# Patient Record
Sex: Male | Born: 1960 | Race: Black or African American | Hispanic: No | Marital: Single | Smoking: Former smoker
Health system: Southern US, Community
[De-identification: ages and names within clinical notes are randomized; demographics above are authoritative.]

## PROBLEM LIST (undated history)

## (undated) HISTORY — PX: HERNIA REPAIR: SHX51

---

## 2015-01-08 ENCOUNTER — Ambulatory Visit
Admission: RE | Admit: 2015-01-08 | Discharge: 2015-01-08 | Disposition: A | Discharge status: Home / Self Care | Payer: BC Managed Care – PPO | Source: Ambulatory Visit | Attending: Family Medicine | Admitting: Family Medicine

## 2015-01-08 ENCOUNTER — Other Ambulatory Visit: Payer: Self-pay | Admitting: Family Medicine

## 2015-01-08 DIAGNOSIS — J40 Bronchitis, not specified as acute or chronic: Secondary | ICD-10-CM

## 2018-09-19 ENCOUNTER — Ambulatory Visit
Admission: RE | Admit: 2018-09-19 | Discharge: 2018-09-19 | Disposition: A | Discharge status: Home / Self Care | Payer: BC Managed Care – PPO | Source: Ambulatory Visit | Attending: Family Medicine | Admitting: Family Medicine

## 2018-09-19 ENCOUNTER — Emergency Department (HOSPITAL_COMMUNITY): Payer: BC Managed Care – PPO

## 2018-09-19 ENCOUNTER — Other Ambulatory Visit: Payer: Self-pay

## 2018-09-19 ENCOUNTER — Inpatient Hospital Stay (HOSPITAL_COMMUNITY)
Admission: EM | Admit: 2018-09-19 | Discharge: 2018-09-21 | DRG: 177 | Disposition: A | Discharge status: Home / Self Care | Payer: BC Managed Care – PPO | Source: Ambulatory Visit | Attending: Internal Medicine | Admitting: Internal Medicine

## 2018-09-19 ENCOUNTER — Other Ambulatory Visit: Payer: Self-pay | Admitting: Family Medicine

## 2018-09-19 ENCOUNTER — Encounter (HOSPITAL_COMMUNITY): Payer: Self-pay

## 2018-09-19 DIAGNOSIS — J1289 Other viral pneumonia: Secondary | ICD-10-CM | POA: Diagnosis present

## 2018-09-19 DIAGNOSIS — R05 Cough: Secondary | ICD-10-CM

## 2018-09-19 DIAGNOSIS — R6889 Other general symptoms and signs: Secondary | ICD-10-CM

## 2018-09-19 DIAGNOSIS — I82431 Acute embolism and thrombosis of right popliteal vein: Secondary | ICD-10-CM | POA: Diagnosis present

## 2018-09-19 DIAGNOSIS — Z20822 Contact with and (suspected) exposure to covid-19: Secondary | ICD-10-CM | POA: Diagnosis present

## 2018-09-19 DIAGNOSIS — F17290 Nicotine dependence, other tobacco product, uncomplicated: Secondary | ICD-10-CM | POA: Diagnosis present

## 2018-09-19 DIAGNOSIS — J189 Pneumonia, unspecified organism: Secondary | ICD-10-CM

## 2018-09-19 DIAGNOSIS — U071 COVID-19: Secondary | ICD-10-CM | POA: Diagnosis not present

## 2018-09-19 DIAGNOSIS — R059 Cough, unspecified: Secondary | ICD-10-CM

## 2018-09-19 DIAGNOSIS — I2699 Other pulmonary embolism without acute cor pulmonale: Secondary | ICD-10-CM | POA: Diagnosis present

## 2018-09-19 DIAGNOSIS — J984 Other disorders of lung: Secondary | ICD-10-CM

## 2018-09-19 DIAGNOSIS — I82442 Acute embolism and thrombosis of left tibial vein: Secondary | ICD-10-CM | POA: Diagnosis present

## 2018-09-19 DIAGNOSIS — R5383 Other fatigue: Secondary | ICD-10-CM

## 2018-09-19 DIAGNOSIS — Z7982 Long term (current) use of aspirin: Secondary | ICD-10-CM

## 2018-09-19 DIAGNOSIS — I82441 Acute embolism and thrombosis of right tibial vein: Secondary | ICD-10-CM | POA: Diagnosis present

## 2018-09-19 DIAGNOSIS — J1282 Pneumonia due to coronavirus disease 2019: Secondary | ICD-10-CM | POA: Diagnosis present

## 2018-09-19 DIAGNOSIS — I82403 Acute embolism and thrombosis of unspecified deep veins of lower extremity, bilateral: Secondary | ICD-10-CM | POA: Diagnosis present

## 2018-09-19 LAB — CBC WITH DIFFERENTIAL/PLATELET
Abs Immature Granulocytes: 0.09 10*3/uL — ABNORMAL HIGH (ref 0.00–0.07)
Basophils Absolute: 0.1 10*3/uL (ref 0.0–0.1)
Basophils Relative: 1 %
Eosinophils Absolute: 0.1 10*3/uL (ref 0.0–0.5)
Eosinophils Relative: 2 %
HCT: 39.1 % (ref 39.0–52.0)
Hemoglobin: 12.5 g/dL — ABNORMAL LOW (ref 13.0–17.0)
Immature Granulocytes: 1 %
Lymphocytes Relative: 15 %
Lymphs Abs: 1.1 10*3/uL (ref 0.7–4.0)
MCH: 30.2 pg (ref 26.0–34.0)
MCHC: 32 g/dL (ref 30.0–36.0)
MCV: 94.4 fL (ref 80.0–100.0)
Monocytes Absolute: 0.7 10*3/uL (ref 0.1–1.0)
Monocytes Relative: 10 %
Neutro Abs: 5.3 10*3/uL (ref 1.7–7.7)
Neutrophils Relative %: 71 %
Platelets: 334 10*3/uL (ref 150–400)
RBC: 4.14 MIL/uL — ABNORMAL LOW (ref 4.22–5.81)
RDW: 13.1 % (ref 11.5–15.5)
WBC: 7.4 10*3/uL (ref 4.0–10.5)
nRBC: 0 % (ref 0.0–0.2)

## 2018-09-19 LAB — BLOOD GAS, VENOUS
Acid-Base Excess: 7 mmol/L — ABNORMAL HIGH (ref 0.0–2.0)
Bicarbonate: 31.3 mmol/L — ABNORMAL HIGH (ref 20.0–28.0)
O2 Saturation: 87.6 %
Patient temperature: 98.6
pCO2, Ven: 44.8 mmHg (ref 44.0–60.0)
pH, Ven: 7.459 — ABNORMAL HIGH (ref 7.250–7.430)
pO2, Ven: 56.7 mmHg — ABNORMAL HIGH (ref 32.0–45.0)

## 2018-09-19 LAB — BASIC METABOLIC PANEL
Anion gap: 14 (ref 5–15)
BUN: 19 mg/dL (ref 6–20)
CO2: 26 mmol/L (ref 22–32)
Calcium: 8.8 mg/dL — ABNORMAL LOW (ref 8.9–10.3)
Chloride: 99 mmol/L (ref 98–111)
Creatinine, Ser: 1 mg/dL (ref 0.61–1.24)
GFR calc Af Amer: >60 mL/min (ref >60)
GFR calc non Af Amer: >60 mL/min (ref >60)
Glucose, Bld: 142 mg/dL — ABNORMAL HIGH (ref 70–99)
Potassium: 3.5 mmol/L (ref 3.5–5.1)
Sodium: 139 mmol/L (ref 135–145)

## 2018-09-19 LAB — LACTIC ACID, PLASMA
Lactic Acid, Venous: 1 mmol/L (ref 0.5–1.9)
Lactic Acid, Venous: 1.2 mmol/L (ref 0.5–1.9)

## 2018-09-19 IMAGING — CT CT CHEST WITH CONTRAST
1 series · 14 of 34 positions shown, 18 images · IV contrast (APPLIED)
Comparison: Chest x-ray [DATE]
COMPARISON: Chest x-ray [DATE]

Addendum:
CLINICAL DATA: Abnormal chest x-ray at physician's office. Two
weeks of cough which shortness-of-breath and reports mold exposure
at home.

EXAM:
CT CHEST WITH CONTRAST
TECHNIQUE: Multidetector CT imaging of the chest was performed during
intravenous contrast administration.
CONTRAST:  75mL [3D] IOPAMIDOL ([3D]) INJECTION 76%

[Series 2: chest w/cm · axial · 0.79mm/px · z∈[-329,-13]mm · 14 of 186 slices shown, 18 images]
[im 14/186  mediastinal]
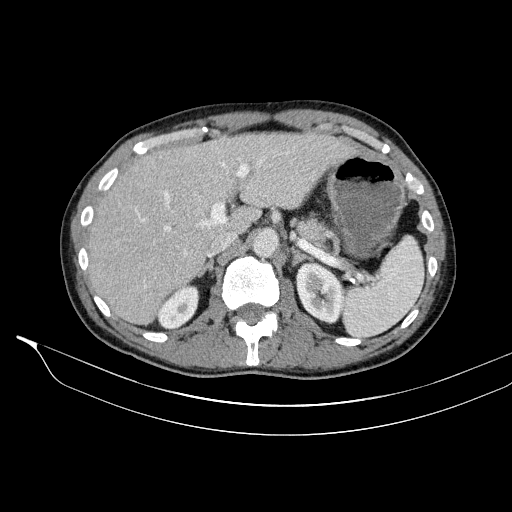
[im 14/186  lung]
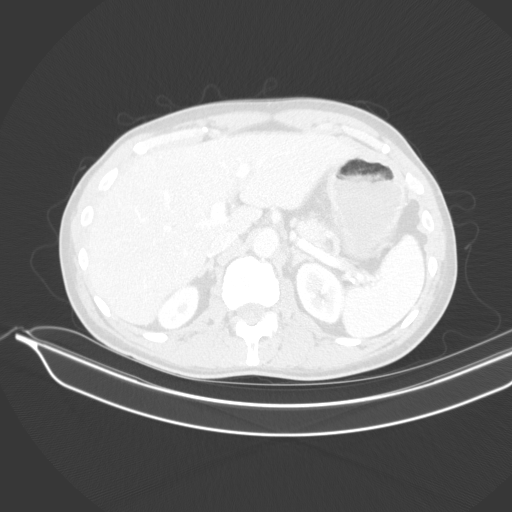
[im 28/186  lung]
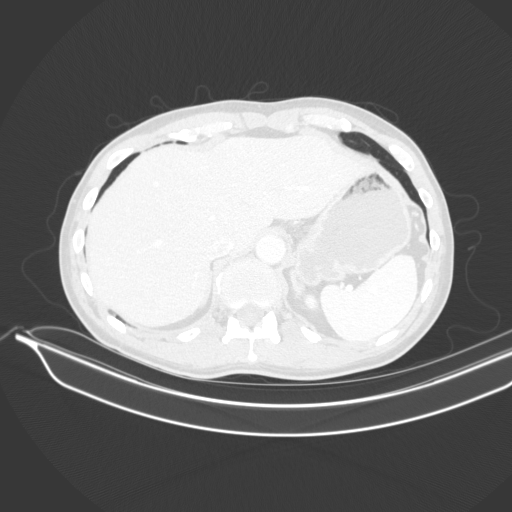
[im 38/186  lung]
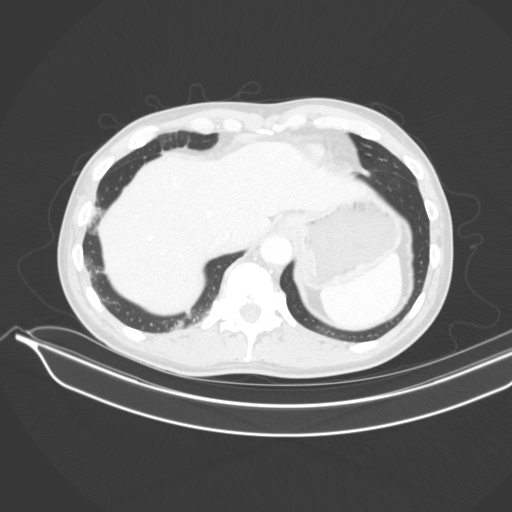
[im 55/186  lung]
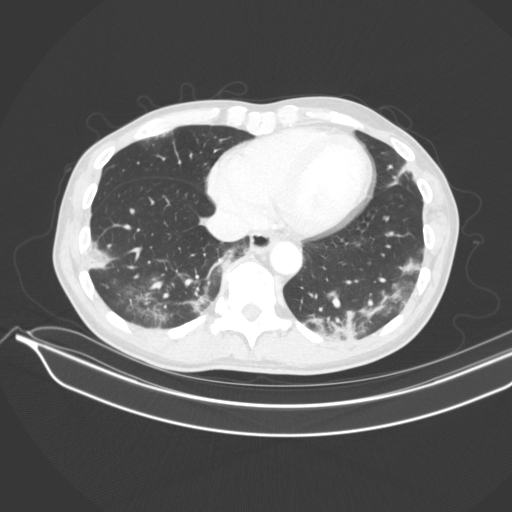
[im 69/186  mediastinal]
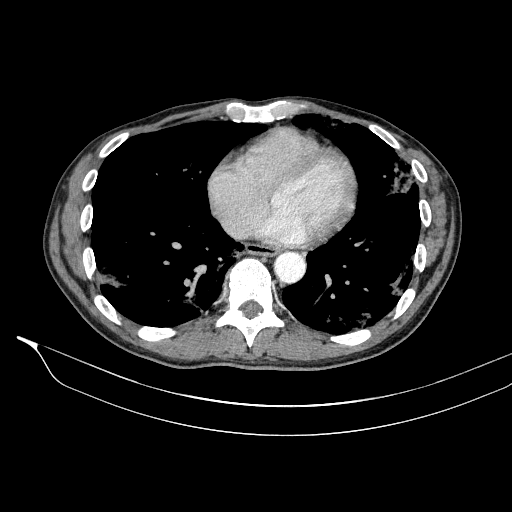
[im 69/186  lung]
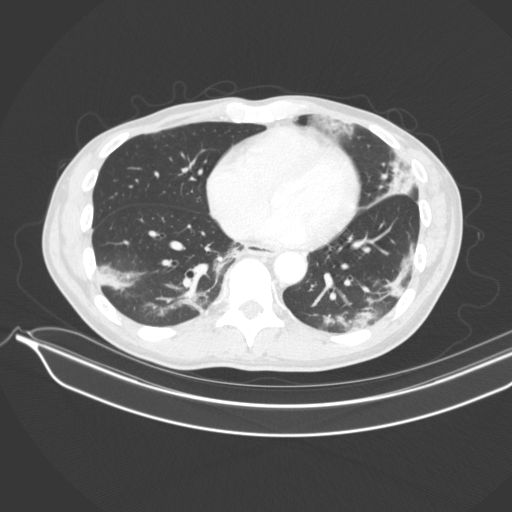
[im 76/186  lung]
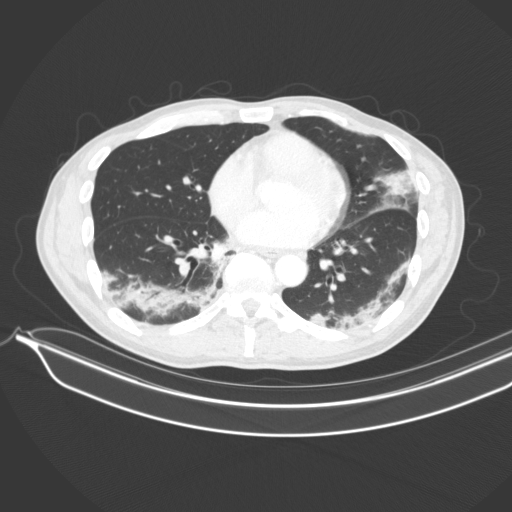
[im 89/186  lung]
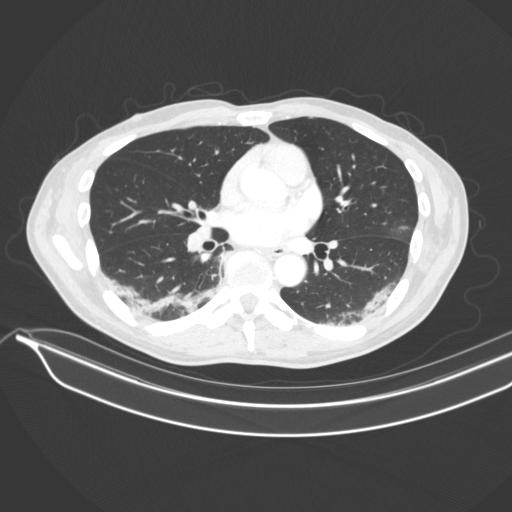
[im 98/186  lung]
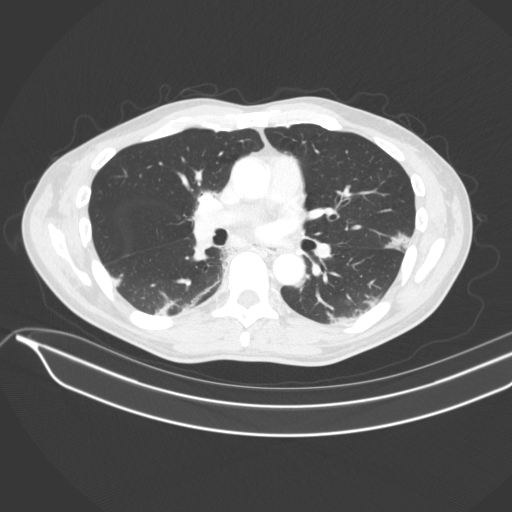
[im 110/186  mediastinal]
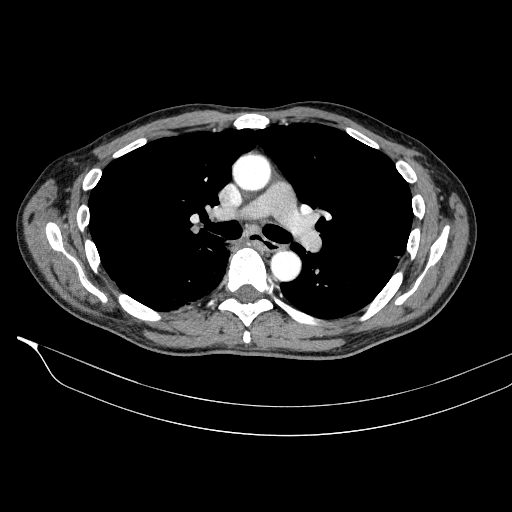
[im 110/186  lung]
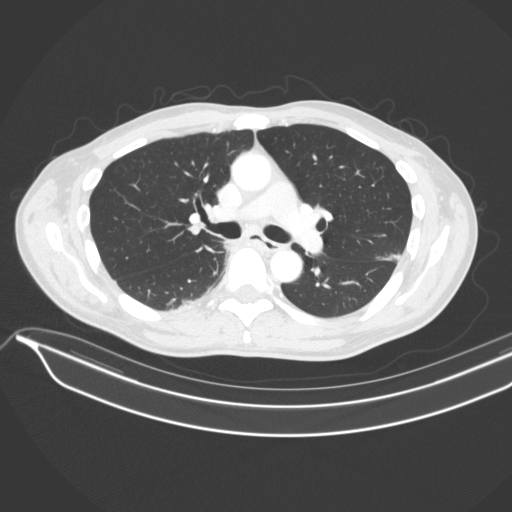
[im 117/186  lung]
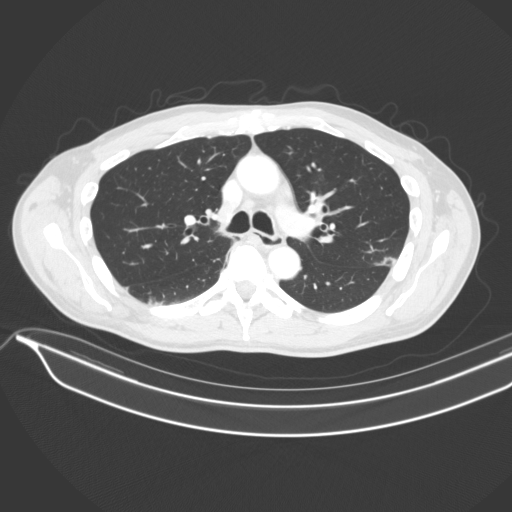
[im 138/186  lung]
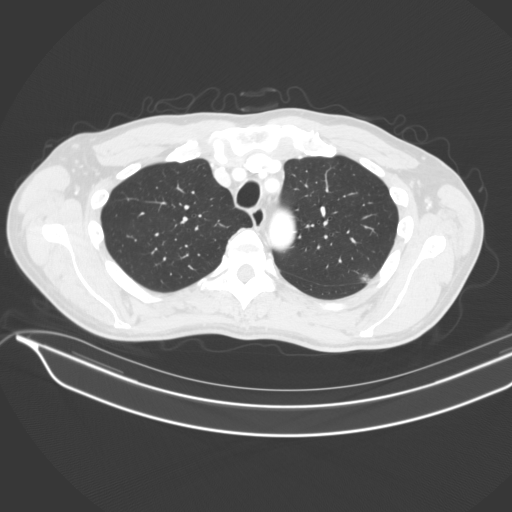
[im 149/186  lung]
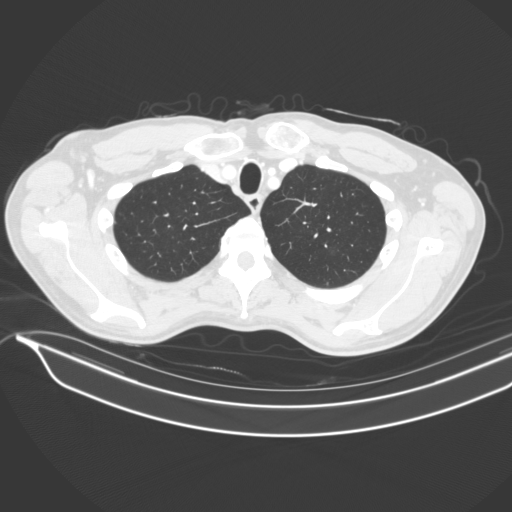
[im 158/186  mediastinal]
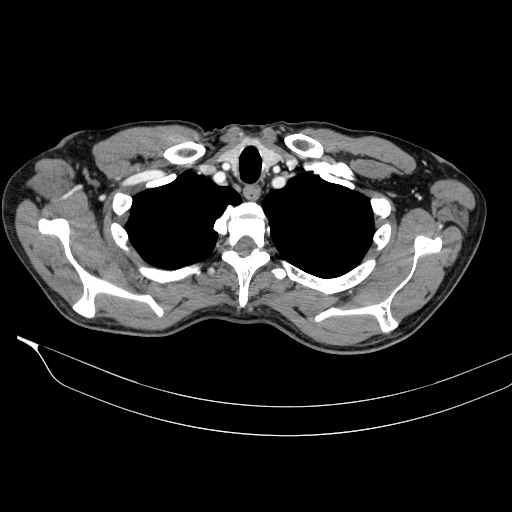
[im 158/186  lung]
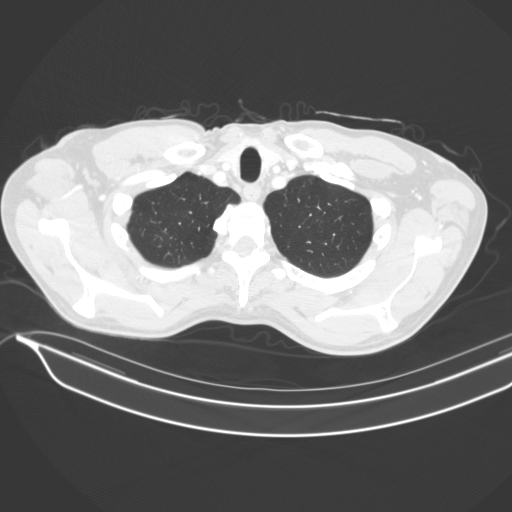
[im 172/186  lung]
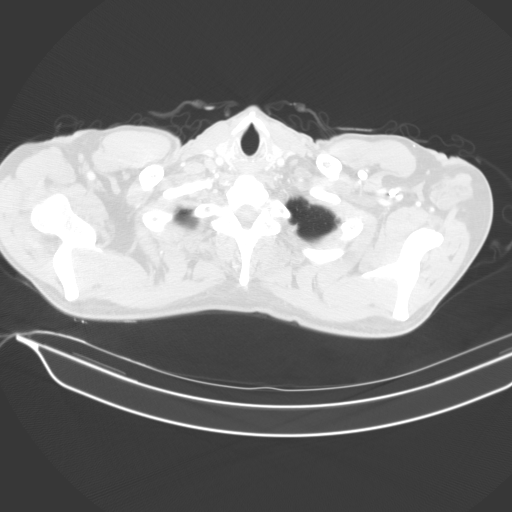

[14 of 34 positions shown; findings below may reference images not displayed]

FINDINGS: Cardiovascular: Heart is normal size. Thoracic aorta is within
normal. Although this study is not optimized for contrast
opacification of the pulmonary arteries, there are findings
suggesting right-sided pulmonary emboli. Remaining vascular
structures are unremarkable.

Mediastinum/Nodes: No mediastinal or hilar adenopathy. Remaining
mediastinal structures are unremarkable. Subcentimeter left thyroid
hypodense nodule.

Lungs/Pleura: Lungs are adequately inflated and demonstrate
bilateral patchy peripheral airspace opacification worse over the
mid to lower lungs. No evidence of effusion. Airways are normal.

Upper Abdomen: No acute findings.

Musculoskeletal: No acute findings.
IMPRESSION: 1. Patchy bilateral peripheral airspace process over the mid to
lower lungs. Findings likely due to multifocal pneumonia which may
be of bacterial or possible viral origin. Recommend follow-up to
resolution.

2. Although this exam is not optimized for pulmonary artery
opacification, there are findings suggesting several right-sided
pulmonary emboli. Recommend CTA of the chest for further evaluation.

ADDENDUM:
Critical Value/emergent results were called by telephone at the time
of interpretation on [DATE] at [DATE] to Dr. WAIRI , who
verbally acknowledged these results.

*** End of Addendum ***
FINDINGS: Cardiovascular: Heart is normal size. Thoracic aorta is within
normal. Although this study is not optimized for contrast
opacification of the pulmonary arteries, there are findings
suggesting right-sided pulmonary emboli. Remaining vascular
structures are unremarkable.

Mediastinum/Nodes: No mediastinal or hilar adenopathy. Remaining
mediastinal structures are unremarkable. Subcentimeter left thyroid
hypodense nodule.

Lungs/Pleura: Lungs are adequately inflated and demonstrate
bilateral patchy peripheral airspace opacification worse over the
mid to lower lungs. No evidence of effusion. Airways are normal.

Upper Abdomen: No acute findings.

Musculoskeletal: No acute findings.
IMPRESSION: 1. Patchy bilateral peripheral airspace process over the mid to
lower lungs. Findings likely due to multifocal pneumonia which may
be of bacterial or possible viral origin. Recommend follow-up to
resolution.

2. Although this exam is not optimized for pulmonary artery
opacification, there are findings suggesting several right-sided
pulmonary emboli. Recommend CTA of the chest for further evaluation.

## 2018-09-19 MED ORDER — IOHEXOL 350 MG/ML SOLN
100.0000 mL | Freq: Once | INTRAVENOUS | Status: AC | PRN
  Administered 2018-09-19: 80 mL via INTRAVENOUS

## 2018-09-19 MED ORDER — SODIUM CHLORIDE (PF) 0.9 % IJ SOLN
INTRAMUSCULAR | Status: AC
  Filled 2018-09-19: qty 50

## 2018-09-19 MED ORDER — SODIUM CHLORIDE 0.9 % IV SOLN
1.0000 g | Freq: Once | INTRAVENOUS | Status: AC
  Administered 2018-09-19: 1 g via INTRAVENOUS
  Filled 2018-09-19: qty 10

## 2018-09-19 MED ORDER — IOPAMIDOL (ISOVUE-370) INJECTION 76%
75.0000 mL | Freq: Once | INTRAVENOUS | Status: AC | PRN
  Administered 2018-09-19: 75 mL via INTRAVENOUS

## 2018-09-19 MED ORDER — SODIUM CHLORIDE 0.9 % IV SOLN
INTRAVENOUS | Status: DC
  Administered 2018-09-19: 22:00:00 via INTRAVENOUS

## 2018-09-19 MED ORDER — SODIUM CHLORIDE 0.9 % IV SOLN
500.0000 mg | Freq: Once | INTRAVENOUS | Status: AC
  Administered 2018-09-19: 500 mg via INTRAVENOUS
  Filled 2018-09-19: qty 500

## 2018-09-19 NOTE — ED Notes (Signed)
Bed: ZC58 Expected date: 09/19/18 Expected time: 7:07 PM Means of arrival:  Comments: Neg Pressure

## 2018-09-19 NOTE — H&P (Addendum)
Derek Norris NWG:[id number] DOB: [birth year] DOA: 09/19/2018     PCP: Patient, No Pcp Per   Outpatient Specialists:  NONE    Patient arrived to ER on 09/19/18 at 2026  Patient coming from: home Lives alone,        Chief Complaint: abnormal CT HPI: Derek Norris is a 58 y.o. 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 He wears mask and gloves.  He has visited his brother  As far as he knows no sick contacts  He had some fever inially he is unsure how high, no Gi symptoms No sore throat, no chest pain,  3 days ago he developed leg tightness and pain Reports change in appetite  He is try to use Tessalon to help with his cough but did not seem to help.  Have not had any fever or chills no nausea vomiting no lightheadedness no fatigue. Chest CT done at a facility today showed bilateral pneumonia questionable PE although it was not done with contrast  Infectious risk factors:  Reports  fever, shortness of breath, dry cough,   severe fatigue   In  ER RAPID COVID TEST   Pending   Regarding pertinent Chronic problems: none     While in ER: Noted on Rocephin and azithromycin given IV fluids CTA confirmed pulmonary embolism but also showed bilateral patchy infiltrates worrisome for COVID-19   The following Work up has been ordered so far:  Orders Placed This Encounter  Procedures   SARS Coronavirus 2 (CEPHEID - Performed in [facility] hospital lab), Hosp Order   Blood Culture (routine x 2)   CT Angio Chest PE W/Cm &/Or Wo Cm   Blood gas, venous   Lactic acid, plasma   Basic metabolic panel   CBC with Differential   Brain natriuretic peptide   Troponin I - Add-On to  previous collection   Hepatic function panel   CK   Magnesium   Protime-INR   Procalcitonin - Baseline   Procalcitonin   Urinalysis, Routine w reflex microscopic   Troponin I - Now Then Q6H   Lactic acid, plasma   D-dimer, quantitative   Lactate dehydrogenase   Ferritin   Triglycerides   Fibrinogen   C-reactive protein   Sedimentation rate   APTT   Interleukin-6, Plasma   Place surgical mask on patient   Patient to wear surgical mask during transportation   Assess patient for ability to self-prone. If able (can move self in bed, ambulate) and stable (SpO2 and oxygen requirement):   Notify EDP if new oxygen requirements escalates > 4L per minute [place]   RN to draw the following extra tubes:   Place on droplet/contact if:   Place on airborne/contact if:   RN/NT - Document specific oxygen requirements in [facility]   Cardiac monitoring   Consult to hospitalist  ALL PATIENTS BEING ADMITTED/HAVING PROCEDURES NEED COVID-19 SCREENING   heparin per pharmacy consult   Consult to care management   Droplet and Contact precautions   EKG 12-Lead   ABO/Rh   Place in observation (patient's expected length of stay will be less than 2 midnights)  Place in observation (patient's expected length of stay will be less than 2 midnights)   Following Medications were ordered in ER: Medications  0.9 %  sodium chloride infusion ( Intravenous New Bag/Given 09/19/18 2151)  sodium chloride (PF) 0.9 % injection (has no administration in time range)  heparin bolus via infusion 5,000 Units (has no administration in time range)  heparin ADULT infusion 100 units/mL (25000 units/23450mL sodium chloride 0.45%) (has no administration in time range)  cefTRIAXone (ROCEPHIN) 1 g in sodium chloride 0.9 % 100 mL IVPB (0 g Intravenous Stopped 09/19/18 2255)  azithromycin (ZITHROMAX) 500 mg in sodium chloride 0.9 % 250 mL IVPB (0 mg Intravenous Stopped 09/20/18 0015)  iohexol (OMNIPAQUE) 350  MG/ML injection 100 mL (80 mLs Intravenous Contrast Given 09/19/18 2301)        Consult Orders  (From admission, onward)         Start     Ordered   09/20/18 0043  Consult to care management  Once    Comments: DOAC  Provider:  (Not yet assigned)  Question:  Reason for consult:  Answer:  Medication needs   09/20/18 0043   09/19/18 2333  Consult to hospitalist  ALL PATIENTS BEING ADMITTED/HAVING PROCEDURES NEED COVID-19 SCREENING  Once    Comments: ALL PATIENTS BEING ADMITTED/HAVING PROCEDURES NEED COVID-19 SCREENING  Provider:  (Not yet assigned)  Question Answer Comment  Place call to: [facility]   Reason for Consult Admit      09/19/18 2332           Significant initial  Findings: Abnormal Labs Reviewed  BLOOD GAS, VENOUS - Abnormal; Notable for the following components:      Result Value   pH, Ven 7.459 (*)    pO2, Ven 56.7 (*)    Bicarbonate 31.3 (*)    Acid-Base Excess 7.0 (*)    All other components within normal limits  BASIC METABOLIC PANEL - Abnormal; Notable for the following components:   Glucose, Bld 142 (*)    Calcium 8.8 (*)    All other components within normal limits  CBC WITH DIFFERENTIAL/PLATELET - Abnormal; Notable for the following components:   RBC 4.14 (*)    Hemoglobin 12.5 (*)    Abs Immature Granulocytes 0.09 (*)    All other components within normal limits  HEPATIC FUNCTION PANEL - Abnormal; Notable for the following components:   Albumin 2.9 (*)    Indirect Bilirubin 0.1 (*)    All other components within normal limits     Otherwise labs showing:    Recent Labs  Lab 09/19/18 2120 09/20/18 0012  NA 139  --   K 3.5  --   CO2 26  --   GLUCOSE 142*  --   BUN 19  --   CREATININE 1.00  --   CALCIUM 8.8*  --   MG  --  2.1    Cr  stable,   Lab Results  Component Value Date   CREATININE 1.00 09/19/2018    Recent Labs  Lab 09/20/18 0012  AST 16  ALT 14  ALKPHOS 45  BILITOT 0.3  PROT 6.8  ALBUMIN 2.9*   Lab  Results  Component Value Date   CALCIUM 8.8 (L) 09/19/2018    WBC      Component Value Date/Time   WBC 7.4 09/19/2018 2120   ANC    Component Value Date/Time   NEUTROABS 5.3 09/19/2018 2120   ALC 1.1   Plt: Lab Results  Component Value Date   PLT 334 09/19/2018    Lactic Acid, Venous    Component Value Date/Time   LATICACIDVEN 1.2 09/19/2018 2325    Procalcitonin pending   COVID-19 Labs pending      Venous  Blood Gas result:  pH 7.459  pCO2 44;   ABG    Component Value Date/Time   HCO3 31.3 (H) 09/19/2018 2138   O2SAT 87.6 09/19/2018 2138      HG/HCT stable,       Component Value Date/Time   HGB 12.5 (L) 09/19/2018 2120   HCT 39.1 09/19/2018 2120     Troponin pending Cardiac Panel (last 3 results) Recent Labs    09/20/18 0012  CKTOTAL 78  TROPONINI <0.03     BNP (last 3 results) No results for input(s): BNP in the last 8760 hours.  ProBNP (last 3 results) No results for input(s): PROBNP in the last 8760 hours.    UA   ordered   Urine analysis: No results found for: COLORURINE, APPEARANCEUR, LABSPEC, PHURINE, GLUCOSEU, HGBUR, BILIRUBINUR, KETONESUR, PROTEINUR, UROBILINOGEN, NITRITE, LEUKOCYTESUR     CTA chest -   PE, bilateral  Infiltrates worrisome for Covid 19   ECG: ordered        ED Triage Vitals  Enc Vitals Group     BP 09/19/18 2034 (!) 157/108     Pulse Rate 09/19/18 2034 99     Resp 09/19/18 2034 20     Temp 09/19/18 2034 97.8 F (36.6 C)     Temp Source 09/19/18 2034 Oral     SpO2 09/19/18 2034 98 %     Weight 09/19/18 2048 170 lb (77.1 kg)     Height 09/19/18 2048  (2.007 m)     Head Circumference --      Peak Flow --      Pain Score 09/19/18 2036 0     Pain Loc --      Pain Edu? --      Excl. in GC? --   TMAX(24)@       Latest  Blood pressure (!) 146/91, pulse 77, temperature 97.8 F (36.6 C), temperature source Oral, resp. rate 18, height  (2.007 m), weight 77.1 kg, SpO2 97 %.     Hospitalist was  called for admission for bilateral infiltrates worrisome for viral pneumonia presumed COVID-19 and pulmonary embolism   Review of Systems:    Pertinent positives include: non-productive cough,  Constitutional:  No weight loss, night sweats, Fevers, chills, fatigue, weight loss  HEENT:  No headaches, Difficulty swallowing,Tooth/dental problems,Sore throat,  No sneezing, itching, ear ache, nasal congestion, post nasal drip,  Cardio-vascular:  No chest pain, Orthopnea, PND, anasarca, dizziness, palpitations.no Bilateral lower extremity swelling  GI:  No heartburn, indigestion, abdominal pain, nausea, vomiting, diarrhea, change in bowel habits, loss of appetite, melena, blood in stool, hematemesis Resp:  noshortness of breath at rest. No dyspnea on exertion , No excess mucus, no productive cough, No No coughing up of blood.No change in color of mucus.No wheezing. Skin:  no rash or lesions. No jaundice GU:  no dysuria, change in color of urine, no urgency or frequency. No straining to urinate.  No flank pain.  Musculoskeletal:  No joint pain or no joint swelling. No decreased range of motion. No back pain.  Psych:  No change in mood or affect. No depression or anxiety. No memory loss.  Neuro: no localizing neurological complaints, no tingling, no weakness, no double vision, no  gait abnormality, no slurred speech, no confusion  All systems reviewed and apart from HOPI all are negative  Past Medical History:  History reviewed. No pertinent past medical history.    Past Surgical History:  Procedure Laterality Date   HERNIA REPAIR      Social History:  Ambulatory    Independently      reports that he has been smoking cigars. He has never used smokeless tobacco. He reports current alcohol use. He reports that he does not use drugs.     Family History:   Family History  Problem Relation Age of Onset   Pancreatic cancer Mother    Hypertension Father    Hypertension  Brother     Allergies: No Known Allergies   Prior to Admission medications   Medication Sig Start Date End Date Taking? Authorizing Provider  acetaminophen (TYLENOL) 325 MG tablet Take 650 mg by mouth every 6 (six) hours as needed for fever or headache.   Yes [provider]  Ascorbic Acid (VITAMIN C) 1000 MG tablet Take 1,000 mg by mouth daily.   Yes [provider]  aspirin 325 MG EC tablet Take 650 mg by mouth daily as needed for pain (blood clot).   Yes [provider]  benzonatate (TESSALON) 200 MG capsule Take 200 mg by mouth 3 (three) times daily. 09/16/18  Yes [provider]  DM-APAP-CPM 30-650-4 MG/30ML LIQD Take 30 mLs by mouth at bedtime as needed (sleep).   Yes [provider]  DM-Phenylephrine-Acetaminophen (ALKA-SELTZER PLS SINUS & COUGH PO) Take 2 tablets by mouth at bedtime as needed (cough and sleep).   Yes [provider]  multivitamin-iron-minerals-folic acid (CENTRUM) chewable tablet Chew 1 tablet by mouth daily.   Yes [provider]  promethazine-codeine (PHENERGAN WITH CODEINE) 6.25-10 MG/5ML syrup Take 5 mLs by mouth every 6 (six) hours as needed for cough.   Yes [provider]   Physical Exam: Blood pressure (!) 146/91, pulse 77, temperature 97.8 F (36.6 C), temperature source Oral, resp. rate 18, height 6\' 7"  (2.007 m), weight 77.1 kg, SpO2 97 %. 1. General:  in No  Acute distress   well   -appearing 2. Psychological: Alert and   Oriented 3. Head/ENT:    Dry Mucous Membranes                           Head Non traumatic, neck supple                            Poor Dentition 4. SKIN:   decreased Skin turgor,  Skin clean Dry and intact no rash 5. Heart: Regular rate and rhythm no  Murmur, no Rub or gallop 6. Lungs:  no wheezes but bilateral  crackles   7. Abdomen: Soft, non-tender, Non distended   bowel sounds present 8. Lower extremities: no clubbing, cyanosis, no  edema 9. Neurologically  Grossly intact, moving all 4 extremities equally  10. MSK: Normal range of motion   All other LABS:     Recent Labs  Lab 09/19/18 2120  WBC 7.4  NEUTROABS 5.3  HGB 12.5*  HCT 39.1  MCV 94.4  PLT 334     Recent Labs  Lab 09/19/18 2120 09/20/18 0012  NA 139  --   K 3.5  --   CL 99  --   CO2 26  --   GLUCOSE 142*  --  BUN 19  --   CREATININE 1.00  --   CALCIUM 8.8*  --   MG  --  2.1     Recent Labs  Lab 09/20/18 0012  AST 16  ALT 14  ALKPHOS 45  BILITOT 0.3  PROT 6.8  ALBUMIN 2.9*       Cultures: No results found for: SDES, SPECREQUEST, CULT, REPTSTATUS   Radiological Exams on Admission: Ct Chest W Contrast  Addendum Date: 09/19/2018   ADDENDUM REPORT: 09/19/2018 16:09 ADDENDUM: Critical Value/emergent results were called by telephone at the time of interpretation on 09/19/2018 at 4:09 pm to Dr. Charlesetta Shanks , who verbally acknowledged these results. Electronically Signed   By: Elberta Fortis M.D.   On: 09/19/2018 16:09   Result Date: 09/19/2018 CLINICAL DATA:  Abnormal chest x-ray at physician's office. Two weeks of cough which shortness-of-breath and reports mold exposure at home. EXAM: CT CHEST WITH CONTRAST TECHNIQUE: Multidetector CT imaging of the chest was performed during intravenous contrast administration. CONTRAST:  75mL ISOVUE-370 IOPAMIDOL (ISOVUE-370) INJECTION 76% COMPARISON:  Chest x-ray 09/16/2018 FINDINGS: Cardiovascular: Heart is normal size. Thoracic aorta is within normal. Although this study is not optimized for contrast opacification of the pulmonary arteries, there are findings suggesting right-sided pulmonary emboli. Remaining vascular structures are unremarkable. Mediastinum/Nodes: No mediastinal or hilar adenopathy. Remaining mediastinal structures are unremarkable. Subcentimeter left thyroid hypodense nodule. Lungs/Pleura: Lungs are adequately inflated and demonstrate bilateral patchy peripheral airspace opacification worse over the mid to  lower lungs. No evidence of effusion. Airways are normal. Upper Abdomen: No acute findings. Musculoskeletal: No acute findings. IMPRESSION: 1. Patchy bilateral peripheral airspace process over the mid to lower lungs. Findings likely due to multifocal pneumonia which may be of bacterial or possible viral origin. Recommend follow-up to resolution. 2. Although this exam is not optimized for pulmonary artery opacification, there are findings suggesting several right-sided pulmonary emboli. Recommend CTA of the chest for further evaluation. Electronically Signed: By: Elberta Fortis M.D. On: 09/19/2018 15:48   Ct Angio Chest Pe W/cm &/or Wo Cm  Addendum Date: 09/19/2018   ADDENDUM REPORT: 09/19/2018 23:32 ADDENDUM: Critical Value/emergent results were called by telephone at the time of interpretation on 09/19/2018 at 2328 hours to Dr. Tommas Olp in the ED who verbally acknowledged these results. Electronically Signed   By: Odessa Fleming M.D.   On: 09/19/2018 23:32   Result Date: 09/19/2018 CLINICAL DATA:  58 year old male with abnormal chest CT earlier today. Shortness of breath. Pending COVID-19 testing. EXAM: CT ANGIOGRAPHY CHEST WITH CONTRAST TECHNIQUE: Multidetector CT imaging of the chest was performed using the standard protocol during bolus administration of intravenous contrast. Multiplanar CT image reconstructions and MIPs were obtained to evaluate the vascular anatomy. CONTRAST:  80mL OMNIPAQUE IOHEXOL 350 MG/ML SOLN COMPARISON:  Chest CT 1518 hours. FINDINGS: Cardiovascular: Good contrast bolus timing in the pulmonary arterial tree. No pulmonary artery filling defect identified in the left lung, but there is pulmonary artery clot at the branching of the right main pulmonary artery extending into the right upper lobe (series 5, image 141, right middle lobe (image 167), and right lower lobe (image 180). Much of the visible thrombus is non occlusive. There is some occlusive thrombus suspected in the posterior  basal segment of the right lower lobe. There is some occlusive thrombus visible in the subsegmental branches to the medial segment of the right middle lobe. No central or saddle embolus. Stable cardiac size at the upper limits of normal. No pericardial effusion. Negative visible  aorta. Mediastinum/Nodes: Negative, no lymphadenopathy. Lungs/Pleura: Major airways remain patent. Confluent but indistinct bilateral peripheral lower lobe, left upper lobe and lingula opacity redemonstrated and this does not directly correspond to areas of pulmonary emboli. No pleural effusion. Upper Abdomen: Negative visible liver, spleen, pancreas, adrenal glands, kidneys, and bowel in the upper abdomen. Musculoskeletal: No acute osseous abnormality identified. Review of the MIP images confirms the above findings. IMPRESSION: 1. Confirmed acute pulmonary emboli with lobar involvement throughout the right lung. However, much of the visible thrombus is nonocclusive, and does not appear to directly correspond to the abnormal pulmonary opacity. 2. Continued superimposed bilateral pulmonary opacity in a pattern suspicious for COVID-19 pneumonia. No pleural effusion. Electronically Signed: By: Genevie Ann M.D. On: 09/19/2018 23:25    Chart has been reviewed    Assessment/Plan   58 y.o. male with no  Significant medical history       Admitted for bilateral infiltrates worrisome for viral pneumonia presumed COVID-19 and pulmonary embolism  Present on Admission:  CAP (community acquired pneumonia) -suspect viral etiology. Already received Rocephin and azithromycin for now will continue until further testing results   Pulmonary embolism ([place]) -  Admit to   Telemetry   Would likely benefit from case manager consult for long term anticoagulation Hold home blood pressure medications avoid hypotension Cycle cardiac enzymes Order echogram and lower extremity Dopplers  Most likely risk factors for hypercoagulable state being  Unknown,  if COVID 19 is positive likely the cause otherwise will need further workup    Suspected Covid-19 Virus Infection -  Initial Rapid Novel Corona Virus testing:  Ordered 09/20/18 and is  pending     - As per hx pt with evidence of fever  Cough: Severe fatigue    Following concerning LAB/ imaging findings:    CT chest: Right side PE bilateral infiltrates with pattern suspicious for COVID-19 Other labs are pending at this time  -Following work-up initiated:        sputum cultures  Ordered 09/20/18, Blood cultures  Ordered 09/20/18,   Novel Corona Virus testing: Ordered      Following complications noted:     will be monitoring for evidence of other complications such as PE/DVT CVA or  cardiovascular events  Plan of treatment: - Transfer to [facility] facility if positive and beds are available  - If patient tests negative, and no evidence of hypoxia with improving clinical status would discharge to home with instructions to continue to self quarantine -Patient with known pulmonary embolism started on heparin - Assess for ability to prone  - Supportive management -Fluid sparing resuscitation  -Provide oxygen as needed currently on room air SpO2: 95 %   To biotics initiated in emergency department if no evidence of bacterial infectious process would discontinue - Consult PCCM if becomes respiratory unstable   Poor Prognostic factors  58 y.o.    Risk for transmission of COVID 19   LOW Given   no evidence of severe cough,  oxygen requirement above 4 L, need for nebulizer, or other high risk of aerosolization     Will order Droplet and Contact precautions  Family/ patient prognosis discussion:  I have asked case with the patient  who are aware of patient's prognosis At this point they would like  atient to be full code    Other plan as per orders.  DVT prophylaxis:  Heparin gtt   Code Status:  FULL CODE as per patient   I had personally discussed CODE  STATUS with patient   Family Communication:   Family not at  Bedside    Disposition Plan:      To home once workup is complete and patient is stable                Consults called:none   Admission status:  ED Disposition    ED Disposition Condition Comment   Ad[facility]  [facility][id number]  Level of Care: Telemetry [5]  Admit to tele based on following criteria: Other see comments  Comments: pe  Covid Evaluation: Person Under Investigation (PUI)  Isolation Risk Level: Low Risk/Droplet (Less than 4L [place] supplementation)  Diagnosis: Pulmonary embolus (HCC) [811914][[id number]]  Admitting Physician: Therisa DoyneUTOVA, Evey Mcmahan [[id number]]  Attending Physician: Therisa DoyneUTOVA, [place]wood Revoir [[id number]]  PT Class (Do Not Modify): Observation [104]  PT Acc Code (Do Not Modify): Observation [10022]        Obs    Level of care   tele  For   24H     Precautions:   Droplet and Contact precautions  PPE: Used by the provider:   P100  eye Goggles,  Gloves  gown   Oanh Devivo 09/20/2018, 1:03 AM    [facility]     after 2 AM please page floor coverage PA If 7AM-7PM, please contact the day team taking care of the patient using [email or web address]

## 2018-09-19 NOTE — ED Triage Notes (Signed)
Pt was seen at his primary today and had a CT that showed possible pneumonia and/or PE. Pt had a cough for a week and went to Urgent Care and then was referred to [facility], had a CT scan today and was then called to come to the ED, Pt has not been tested for COVID at any of these visits

## 2018-09-19 NOTE — ED Provider Notes (Signed)
[facility] DEPT Provider Note   CSN: [id number] Arrival date & time: 09/19/18  2026    History   Chief Complaint No chief complaint on file.   HPI Derek Norris is a 58 y.o. male.     HPI   He presents for evaluation of cough present for 2 weeks, worsening despite treatment with Tessalon.  He does not have shortness of breath.  Denies fever, chills, nausea, vomiting, weakness or dizziness.  Today he had a chest x-ray and CT done, indicating pneumonia and possible PE.  The chest CT was not done with angiography technique.  The results, are with the patient, and indicate that he has bilateral pneumonia and possible right-sided PE.  He does not have a history of venous thromboembolism.  No cardiac history.  No known sick contacts.  There are no other known modifying factors.  History reviewed. No pertinent past medical history.  There are no active problems to display for this patient.   History reviewed. No pertinent surgical history.      Home Medications    Prior to Admission medications   Medication Sig Start Date End Date Taking? Authorizing Provider  acetaminophen (TYLENOL) 325 MG tablet Take 650 mg by mouth every 6 (six) hours as needed for fever or headache.   Yes [provider]  Ascorbic Acid (VITAMIN C) 1000 MG tablet Take 1,000 mg by mouth daily.   Yes [provider]  aspirin 325 MG EC tablet Take 650 mg by mouth daily as needed for pain (blood clot).   Yes [provider]  benzonatate (TESSALON) 200 MG capsule Take 200 mg by mouth 3 (three) times daily. 09/16/18  Yes [provider]  DM-APAP-CPM 30-650-4 MG/30ML LIQD Take 30 mLs by mouth at bedtime as needed (sleep).   Yes [provider]  DM-Phenylephrine-Acetaminophen (ALKA-SELTZER PLS SINUS & COUGH PO) Take 2 tablets by mouth at bedtime as needed (cough and sleep).   Yes [provider]  multivitamin-iron-minerals-folic acid  (CENTRUM) chewable tablet Chew 1 tablet by mouth daily.   Yes [provider]  promethazine-codeine (PHENERGAN WITH CODEINE) 6.25-10 MG/5ML syrup Take 5 mLs by mouth every 6 (six) hours as needed for cough.   Yes [provider]    Family History History reviewed. No pertinent family history.  Social History Social History   Tobacco Use  . Smoking status: Unknown If Ever Smoked  Substance Use Topics  . Alcohol use: Yes  . Drug use: Never     Allergies   Patient has no known allergies.   Review of Systems Review of Systems  All other systems reviewed and are negative.    Physical Exam Updated Vital Signs BP 126/85   Pulse 71   Temp 97.8 F (36.6 C) (Oral)   Resp 19   Ht 6\' 7"  (2.007 m)   Wt 77.1 kg   SpO2 95%   BMI 19.15 kg/m   Physical Exam Vitals signs and nursing note reviewed.  Constitutional:      General: He is not in acute distress.    Appearance: He is well-developed. He is not ill-appearing or diaphoretic.  HENT:     Head: Normocephalic and atraumatic.     Right Ear: External ear normal.     Left Ear: External ear normal.     Mouth/Throat:     Pharynx: Oropharynx is clear. No oropharyngeal exudate or posterior oropharyngeal erythema.  Eyes:     Conjunctiva/sclera: Conjunctivae normal.  Pupils: Pupils are equal, round, and reactive to light.  Neck:     Musculoskeletal: Normal range of motion and neck supple.     Trachea: Phonation normal.  Cardiovascular:     Rate and Rhythm: Normal rate and regular rhythm.     Heart sounds: Normal heart sounds.  Pulmonary:     Effort: Pulmonary effort is normal.     Comments: Bilateral rhonchi with decreased air movement right greater than left.  There is no increased work of breathing. Abdominal:     Palpations: Abdomen is soft.     Tenderness: There is no abdominal tenderness.  Musculoskeletal: Normal range of motion.     Right lower leg: No edema.     Left lower leg: No edema.   Skin:    General: Skin is warm and dry.     Coloration: Skin is not jaundiced.  Neurological:     Mental Status: He is alert and oriented to person, place, and time.     Cranial Nerves: No cranial nerve deficit.     Sensory: No sensory deficit.     Motor: No abnormal muscle tone.     Coordination: Coordination normal.  Psychiatric:        Mood and Affect: Mood normal.        Behavior: Behavior normal.        Thought Content: Thought content normal.        Judgment: Judgment normal.      ED Treatments / Results  Labs (all labs ordered are listed, but only abnormal results are displayed) Labs Reviewed  BLOOD GAS, VENOUS - Abnormal; Notable for the following components:      Result Value   pH, Ven 7.459 (*)    pO2, Ven 56.7 (*)    Bicarbonate 31.3 (*)    Acid-Base Excess 7.0 (*)    All other components within normal limits  BASIC METABOLIC PANEL - Abnormal; Notable for the following components:   Glucose, Bld 142 (*)    Calcium 8.8 (*)    All other components within normal limits  CBC WITH DIFFERENTIAL/PLATELET - Abnormal; Notable for the following components:   RBC 4.14 (*)    Hemoglobin 12.5 (*)    Abs Immature Granulocytes 0.09 (*)    All other components within normal limits  SARS CORONAVIRUS 2 (HOSPITAL ORDER, PERFORMED IN [facility])  LACTIC ACID, PLASMA  LACTIC ACID, PLASMA    EKG None  Radiology Ct Chest W Contrast  Addendum Date: 09/19/2018   ADDENDUM REPORT: 09/19/2018 16:09 ADDENDUM: Critical Value/emergent results were called by telephone at the time of interpretation on 09/19/2018 at 4:09 pm to Dr. Charlesetta ShanksMARIA IBARRA , who verbally acknowledged these results. Electronically Signed   By: Elberta Fortisaniel  Boyle M.D.   On: 09/19/2018 16:09   Result Date: 09/19/2018 CLINICAL DATA:  Abnormal chest x-ray at physician's office. Two weeks of cough which shortness-of-breath and reports mold exposure at home. EXAM: CT CHEST WITH CONTRAST TECHNIQUE: Multidetector CT  imaging of the chest was performed during intravenous contrast administration. CONTRAST:  75mL ISOVUE-370 IOPAMIDOL (ISOVUE-370) INJECTION 76% COMPARISON:  Chest x-ray 09/16/2018 FINDINGS: Cardiovascular: Heart is normal size. Thoracic aorta is within normal. Although this study is not optimized for contrast opacification of the pulmonary arteries, there are findings suggesting right-sided pulmonary emboli. Remaining vascular structures are unremarkable. Mediastinum/Nodes: No mediastinal or hilar adenopathy. Remaining mediastinal structures are unremarkable. Subcentimeter left thyroid hypodense nodule. Lungs/Pleura: Lungs are adequately inflated and demonstrate bilateral patchy peripheral  airspace opacification worse over the mid to lower lungs. No evidence of effusion. Airways are normal. Upper Abdomen: No acute findings. Musculoskeletal: No acute findings. IMPRESSION: 1. Patchy bilateral peripheral airspace process over the mid to lower lungs. Findings likely due to multifocal pneumonia which may be of bacterial or possible viral origin. Recommend follow-up to resolution. 2. Although this exam is not optimized for pulmonary artery opacification, there are findings suggesting several right-sided pulmonary emboli. Recommend CTA of the chest for further evaluation. Electronically Signed: By: Elberta Fortisaniel  Boyle M.D. On: 09/19/2018 15:48    Procedures .Critical Care Performed by: Mancel BaleWentz, Chala Gul, MD Authorized by: Mancel BaleWentz, Yesha Muchow, MD   Critical care provider statement:    Critical care time (minutes):  35   Critical care start time:  09/19/2018 8:45 PM   Critical care end time:  09/19/2018 11:25 PM   Critical care time was exclusive of:  Separately billable procedures and treating other patients   Critical care was necessary to treat or prevent imminent or life-threatening deterioration of the following conditions:  Respiratory failure   Critical care was time spent personally by me on the following activities:   Blood draw for specimens, development of treatment plan with patient or surrogate, discussions with consultants, evaluation of patient's response to treatment, examination of patient, obtaining history from patient or surrogate, ordering and performing treatments and interventions, ordering and review of laboratory studies, pulse oximetry, re-evaluation of patient's condition, review of old charts and ordering and review of radiographic studies   (including critical care time)  Medications Ordered in ED Medications  0.9 %  sodium chloride infusion ( Intravenous New Bag/Given 09/19/18 2151)  azithromycin (ZITHROMAX) 500 mg in sodium chloride 0.9 % 250 mL IVPB (500 mg Intravenous New Bag/Given 09/19/18 2257)  sodium chloride (PF) 0.9 % injection (has no administration in time range)  cefTRIAXone (ROCEPHIN) 1 g in sodium chloride 0.9 % 100 mL IVPB (0 g Intravenous Stopped 09/19/18 2255)  iohexol (OMNIPAQUE) 350 MG/ML injection 100 mL (80 mLs Intravenous Contrast Given 09/19/18 2301)     Initial Impression / Assessment and Plan / ED Course  I have reviewed the triage vital signs and the nursing notes.  Pertinent labs & imaging results that were available during my care of the patient were reviewed by me and considered in my medical decision making (see chart for details).  Clinical Course as of Sep 19 2314  Thu Sep 19, 2018  2311 Abnormal, pH high, PO2 high, bicarb high, acid-base high  Blood gas, venous(!) [EW]  2312 Abnormal, hemoglobin low  CBC with Differential(!) [EW]  2312 Normal  Lactic acid, plasma [EW]  2312 Normal except glucose high, calcium low  Basic metabolic panel(!) [EW]    Clinical Course User Index [EW] Mancel BaleWentz, Emigdio Wildeman, MD        Patient Vitals for the past 24 hrs:  BP Temp Temp src Pulse Resp SpO2 Height Weight  09/19/18 2230 126/85 - - 71 19 95 % - -  09/19/18 2200 (!) 147/100 - - 79 (!) 24 96 % - -  09/19/18 2130 (!) 132/103 - - 79 (!) 25 94 % - -  09/19/18 2100  122/80 - - 81 20 96 % - -  09/19/18 2048 - - - - - - 6\' 7"  (2.007 m) 77.1 kg  09/19/18 2034 (!) 157/108 97.8 F (36.6 C) Oral 99 20 98 % - -    CT angiogram ordered to follow-up on abnormal outpatient CT chest, concerning for pulmonary embolism.  Medical Decision Making: Cough for 2 weeks, with findings of pneumonia and possible PE on non-angiography CT.  Suspect community-acquired pneumonia.  Derek GrumblingCedric Eisenstein was evaluated in Emergency Department on 09/20/2018 for the symptoms described in the history of present illness. He was evaluated in the context of the global COVID-19 pandemic, which necessitated consideration that the patient might be at risk for infection with the SARS-CoV-2 virus that causes COVID-19. Institutional protocols and algorithms that pertain to the evaluation of patients at risk for COVID-19 are in a state of rapid change based on information released by regulatory bodies including the CDC and federal and state organizations. These policies and algorithms were followed during the patient's care in the ED.  CRITICAL CARE- yes Performed by: Mancel BaleElliott Euel Castile  Nursing Notes Reviewed/ Care Coordinated Applicable Imaging Reviewed Interpretation of Laboratory Data incorporated into ED treatment  Plan-disposition per oncoming provider team after completion of evaluation  Final Clinical Impressions(s) / ED Diagnoses   Final diagnoses:  Community acquired pneumonia, unspecified laterality    ED Discharge Orders    None       Mancel BaleWentz, Lailyn Appelbaum, MD 09/20/18 2201

## 2018-09-19 NOTE — ED Notes (Addendum)
Pt started took 2, 81mg  Asprin today at [facility]

## 2018-09-19 NOTE — ED Notes (Signed)
Pt transported to CT ?

## 2018-09-19 NOTE — ED Notes (Signed)
Pt records at bedside.

## 2018-09-20 ENCOUNTER — Observation Stay (HOSPITAL_COMMUNITY): Payer: Self-pay

## 2018-09-20 ENCOUNTER — Encounter (HOSPITAL_COMMUNITY): Payer: Self-pay | Admitting: Internal Medicine

## 2018-09-20 ENCOUNTER — Observation Stay (HOSPITAL_COMMUNITY): Payer: BC Managed Care – PPO

## 2018-09-20 DIAGNOSIS — I82442 Acute embolism and thrombosis of left tibial vein: Secondary | ICD-10-CM | POA: Diagnosis not present

## 2018-09-20 DIAGNOSIS — U071 COVID-19: Principal | ICD-10-CM | POA: Diagnosis present

## 2018-09-20 DIAGNOSIS — I82403 Acute embolism and thrombosis of unspecified deep veins of lower extremity, bilateral: Secondary | ICD-10-CM | POA: Diagnosis not present

## 2018-09-20 DIAGNOSIS — I82431 Acute embolism and thrombosis of right popliteal vein: Secondary | ICD-10-CM

## 2018-09-20 DIAGNOSIS — J1289 Other viral pneumonia: Secondary | ICD-10-CM | POA: Diagnosis present

## 2018-09-20 DIAGNOSIS — F17290 Nicotine dependence, other tobacco product, uncomplicated: Secondary | ICD-10-CM | POA: Diagnosis not present

## 2018-09-20 DIAGNOSIS — I2699 Other pulmonary embolism without acute cor pulmonale: Secondary | ICD-10-CM | POA: Diagnosis present

## 2018-09-20 DIAGNOSIS — I82441 Acute embolism and thrombosis of right tibial vein: Secondary | ICD-10-CM

## 2018-09-20 DIAGNOSIS — J189 Pneumonia, unspecified organism: Secondary | ICD-10-CM | POA: Diagnosis present

## 2018-09-20 DIAGNOSIS — Z7982 Long term (current) use of aspirin: Secondary | ICD-10-CM | POA: Diagnosis not present

## 2018-09-20 DIAGNOSIS — I82451 Acute embolism and thrombosis of right peroneal vein: Secondary | ICD-10-CM

## 2018-09-20 DIAGNOSIS — I2694 Multiple subsegmental pulmonary emboli without acute cor pulmonale: Secondary | ICD-10-CM

## 2018-09-20 DIAGNOSIS — Z20822 Contact with and (suspected) exposure to covid-19: Secondary | ICD-10-CM | POA: Diagnosis present

## 2018-09-20 LAB — HEPATIC FUNCTION PANEL
ALT: 14 U/L (ref 0–44)
AST: 16 U/L (ref 15–41)
Albumin: 2.9 g/dL — ABNORMAL LOW (ref 3.5–5.0)
Alkaline Phosphatase: 45 U/L (ref 38–126)
Bilirubin, Direct: 0.2 mg/dL (ref 0.0–0.2)
Indirect Bilirubin: 0.1 mg/dL — ABNORMAL LOW (ref 0.3–0.9)
Total Bilirubin: 0.3 mg/dL (ref 0.3–1.2)
Total Protein: 6.8 g/dL (ref 6.5–8.1)

## 2018-09-20 LAB — URINALYSIS, ROUTINE W REFLEX MICROSCOPIC
Bacteria, UA: NONE SEEN
Bilirubin Urine: NEGATIVE
Glucose, UA: 50 mg/dL — AB
Hgb urine dipstick: NEGATIVE
Ketones, ur: 5 mg/dL — AB
Leukocytes,Ua: NEGATIVE
Nitrite: NEGATIVE
Protein, ur: 30 mg/dL — AB
Specific Gravity, Urine: >1.046 — ABNORMAL HIGH (ref 1.005–1.030)
pH: 5 (ref 5.0–8.0)

## 2018-09-20 LAB — FIBRINOGEN: Fibrinogen: 721 mg/dL — ABNORMAL HIGH (ref 210–475)

## 2018-09-20 LAB — CBC
HCT: 37.1 % — ABNORMAL LOW (ref 39.0–52.0)
Hemoglobin: 11.5 g/dL — ABNORMAL LOW (ref 13.0–17.0)
MCH: 29.6 pg (ref 26.0–34.0)
MCHC: 31 g/dL (ref 30.0–36.0)
MCV: 95.6 fL (ref 80.0–100.0)
Platelets: 353 10*3/uL (ref 150–400)
RBC: 3.88 MIL/uL — ABNORMAL LOW (ref 4.22–5.81)
RDW: 13.2 % (ref 11.5–15.5)
WBC: 6.5 10*3/uL (ref 4.0–10.5)
nRBC: 0 % (ref 0.0–0.2)

## 2018-09-20 LAB — C-REACTIVE PROTEIN: CRP: 8.4 mg/dL — ABNORMAL HIGH (ref <1.0)

## 2018-09-20 LAB — MAGNESIUM: Magnesium: 2.1 mg/dL (ref 1.7–2.4)

## 2018-09-20 LAB — TSH: TSH: 0.788 u[IU]/mL (ref 0.350–4.500)

## 2018-09-20 LAB — SARS CORONAVIRUS 2 BY RT PCR (HOSPITAL ORDER, PERFORMED IN ~~LOC~~ HOSPITAL LAB): SARS Coronavirus 2: POSITIVE — AB

## 2018-09-20 LAB — PROTIME-INR
INR: 1.1 (ref 0.8–1.2)
Prothrombin Time: 14.5 seconds (ref 11.4–15.2)

## 2018-09-20 LAB — PHOSPHORUS: Phosphorus: 3.2 mg/dL (ref 2.5–4.6)

## 2018-09-20 LAB — TROPONIN I: Troponin I: <0.03 ng/mL (ref <0.03)

## 2018-09-20 LAB — BRAIN NATRIURETIC PEPTIDE: B Natriuretic Peptide: 32.8 pg/mL (ref 0.0–100.0)

## 2018-09-20 LAB — CK: Total CK: 78 U/L (ref 49–397)

## 2018-09-20 LAB — FERRITIN: Ferritin: 650 ng/mL — ABNORMAL HIGH (ref 24–336)

## 2018-09-20 LAB — TRIGLYCERIDES: Triglycerides: 142 mg/dL (ref <150)

## 2018-09-20 LAB — LACTATE DEHYDROGENASE: LDH: 178 U/L (ref 98–192)

## 2018-09-20 LAB — PREALBUMIN: Prealbumin: 10.7 mg/dL — ABNORMAL LOW (ref 18–38)

## 2018-09-20 LAB — APTT: aPTT: 36 seconds (ref 24–36)

## 2018-09-20 LAB — PROCALCITONIN: Procalcitonin: 0.15 ng/mL

## 2018-09-20 LAB — D-DIMER, QUANTITATIVE: D-Dimer, Quant: >20 ug/mL-FEU — ABNORMAL HIGH (ref 0.00–0.50)

## 2018-09-20 LAB — HIV ANTIBODY (ROUTINE TESTING W REFLEX): HIV Screen 4th Generation wRfx: NONREACTIVE

## 2018-09-20 LAB — SEDIMENTATION RATE: Sed Rate: 81 mm/hr — ABNORMAL HIGH (ref 0–16)

## 2018-09-20 MED ORDER — SODIUM CHLORIDE 0.9% FLUSH
3.0000 mL | Freq: Two times a day (BID) | INTRAVENOUS | Status: DC
  Administered 2018-09-20 – 2018-09-21 (×3): 3 mL via INTRAVENOUS

## 2018-09-20 MED ORDER — RIVAROXABAN 15 MG PO TABS
15.0000 mg | ORAL_TABLET | Freq: Two times a day (BID) | ORAL | Status: DC
  Administered 2018-09-20 – 2018-09-21 (×3): 15 mg via ORAL
  Filled 2018-09-20 (×4): qty 1

## 2018-09-20 MED ORDER — ZINC SULFATE 220 (50 ZN) MG PO CAPS
220.0000 mg | ORAL_CAPSULE | Freq: Every day | ORAL | Status: DC
  Administered 2018-09-20 – 2018-09-21 (×2): 220 mg via ORAL
  Filled 2018-09-20 (×2): qty 1

## 2018-09-20 MED ORDER — RIVAROXABAN 20 MG PO TABS: 20.0000 mg | ORAL_TABLET | Freq: Every day | ORAL | Status: DC

## 2018-09-20 MED ORDER — VITAMIN C 500 MG PO TABS
500.0000 mg | ORAL_TABLET | Freq: Every day | ORAL | Status: DC
  Administered 2018-09-20 – 2018-09-21 (×2): 500 mg via ORAL
  Filled 2018-09-20 (×2): qty 1

## 2018-09-20 MED ORDER — SODIUM CHLORIDE 0.9 % IV SOLN: 250.0000 mL | INTRAVENOUS | Status: DC | PRN

## 2018-09-20 MED ORDER — GUAIFENESIN-DM 100-10 MG/5ML PO SYRP
10.0000 mL | ORAL_SOLUTION | ORAL | Status: DC | PRN
  Administered 2018-09-20: 10 mL via ORAL
  Filled 2018-09-20: qty 10

## 2018-09-20 MED ORDER — SODIUM CHLORIDE 0.9% FLUSH: 3.0000 mL | INTRAVENOUS | Status: DC | PRN

## 2018-09-20 MED ORDER — BENZONATATE 100 MG PO CAPS
200.0000 mg | ORAL_CAPSULE | Freq: Three times a day (TID) | ORAL | Status: DC
  Administered 2018-09-20 – 2018-09-21 (×4): 200 mg via ORAL
  Filled 2018-09-20 (×4): qty 2

## 2018-09-20 MED ORDER — ACETAMINOPHEN 650 MG RE SUPP: 650.0000 mg | Freq: Four times a day (QID) | RECTAL | Status: DC | PRN

## 2018-09-20 MED ORDER — HEPARIN (PORCINE) 25000 UT/250ML-% IV SOLN
1600.0000 [IU]/h | INTRAVENOUS | Status: DC
  Administered 2018-09-20: 01:00:00 1600 [IU]/h via INTRAVENOUS
  Filled 2018-09-20: qty 250

## 2018-09-20 MED ORDER — HYDROCODONE-ACETAMINOPHEN 5-325 MG PO TABS: 1.0000 | ORAL_TABLET | ORAL | Status: DC | PRN

## 2018-09-20 MED ORDER — HEPARIN BOLUS VIA INFUSION
5000.0000 [IU] | Freq: Once | INTRAVENOUS | Status: AC
  Administered 2018-09-20: 01:00:00 5000 [IU] via INTRAVENOUS
  Filled 2018-09-20: qty 5000

## 2018-09-20 MED ORDER — ONDANSETRON HCL 4 MG PO TABS: 4.0000 mg | ORAL_TABLET | Freq: Four times a day (QID) | ORAL | Status: DC | PRN

## 2018-09-20 MED ORDER — METHYLPREDNISOLONE SODIUM SUCC 40 MG IJ SOLR
40.0000 mg | Freq: Two times a day (BID) | INTRAMUSCULAR | Status: DC
  Administered 2018-09-20 – 2018-09-21 (×3): 40 mg via INTRAVENOUS
  Filled 2018-09-20 (×3): qty 1

## 2018-09-20 MED ORDER — HYDROCOD POLST-CPM POLST ER 10-8 MG/5ML PO SUER
5.0000 mL | Freq: Two times a day (BID) | ORAL | Status: DC | PRN
  Administered 2018-09-20: 5 mL via ORAL
  Filled 2018-09-20: qty 5

## 2018-09-20 MED ORDER — ACETAMINOPHEN 325 MG PO TABS: 650.0000 mg | ORAL_TABLET | Freq: Four times a day (QID) | ORAL | Status: DC | PRN

## 2018-09-20 MED ORDER — ONDANSETRON HCL 4 MG/2ML IJ SOLN: 4.0000 mg | Freq: Four times a day (QID) | INTRAMUSCULAR | Status: DC | PRN

## 2018-09-20 NOTE — ED Notes (Signed)
Report given to [facility]  

## 2018-09-20 NOTE — Progress Notes (Signed)
ANTICOAGULATION CONSULT NOTE - Follow Up Consult  Pharmacy Consult for IV heparin > Xarelto Indication: pulmonary embolus  No Known Allergies  Patient Measurements: Height: 6\' 7"  (200.7 cm) Weight: 170 lb (77.1 kg) IBW/kg (Calculated) : 93.7 Heparin Dosing Weight: actual  Vital Signs: Temp: 98 F (36.7 C) (06/12 0750) Temp Source: Oral (06/12 0750) BP: 134/83 (06/12 0750) Pulse Rate: 69 (06/12 0750)  Labs: Recent Labs    09/19/18 2120 09/20/18 0012 09/20/18 0014 09/20/18 0630  HGB 12.5*  --   --  11.5*  HCT 39.1  --   --  37.1*  PLT 334  --   --  353  APTT  --   --  36  --   LABPROT  --  14.5  --   --   INR  --  1.1  --   --   CREATININE 1.00  --   --   --   CKTOTAL  --  78  --   --   TROPONINI  --  <0.03  --   --     Estimated Creatinine Clearance: 87.8 mL/min (by C-G formula based on SCr of 1 mg/dL).   Medical History: History reviewed. No pertinent past medical history.  Medications:  Scheduled:  . benzonatate  200 mg Oral TID  . sodium chloride (PF)      . sodium chloride flush  3 mL Intravenous Q12H  . vitamin C  500 mg Oral Daily  . zinc sulfate  220 mg Oral Daily   Infusions:  . sodium chloride 10 mL/hr at 09/20/18 0600  . sodium chloride    . heparin 1,600 Units/hr (09/20/18 0112)    Assessment: 72 yoM seen by MD today diagnosed with PNA and mulitple PEs in right lung currently on IV heparin. Pharmacy consulted to switch patient to Xarelto.   H/H down a bit. Plt wnl. SCr wnl   Goal of Therapy:  PE treatment Monitor platelets by anticoagulation protocol: Yes   Plan:  Stop IV heparin Start Xarelto 15 mg BID x 21 days, then start Xarelto 20 mg daily with supper Monitor closely for bleeding Will educate patient prior to discharge   Albertina Parr, PharmD., BCPS Clinical Pharmacist Clinical phone for 09/20/18 until 5pm: [phone]

## 2018-09-20 NOTE — Plan of Care (Signed)

## 2018-09-20 NOTE — Progress Notes (Addendum)
PROGRESS NOTE                                                                                                                                                                                                             Patient Demographics:    Derek Norris, is a 58 y.o. male, DOB - Jan 12, 1961, UJW:[id number]  Outpatient Primary MD for the patient is Patient, No Pcp Per    LOS - 0  No chief complaint on file.      Brief Narrative: Patient is a 58 y.o. male with no significant past medical history-presented with almost 2-week history of cough, fever.  Upon subsequent evaluation -he was found to have COVID-19 viral pneumonia and pulmonary embolism.  He was subsequently admitted to the hospitalist service-see below for further details   Subjective:    He continues to cough-but is not hypoxic-O2 saturation remained in the high 90s on room air.  He appears comfortable.  Denies any chest pain.   Assessment  & Plan :  Covid 19 Viral pneumonia: Has coughing spells-but not hypoxic-some scattered rhonchi-does not qualify for Remdesivir (as O2 saturation in the high 90s)-but since has some rhonchi-suspect has some reactive airway disease-we will go ahead and start  IV steroids, continue bronchodilators.  Inflammatory markers-including CRP and ferritin are elevated.  We will monitor for another day-if continues to do well-suspect could go home soon.  If he develops hypoxia-then will start Remdesivir.  COVID-19 Labs:  Recent Labs    09/20/18 0012  DDIMER >20.00*  FERRITIN 650*  LDH 178  CRP 8.4*    Lab Results  Component Value Date   SARSCOV2NAA POSITIVE (A) 09/19/2018    Pulmonary embolism: Appears to be unprovoked-no prior history of family history of VTE.  Fairly active-no travel history.  Not sure if this is related to COVID-19-in any event-we will go ahead and transition from IV heparin to Xarelto.  No clinical signs of RV  failure at this point-awaiting lower extremity Doppler.  We will watch 1 additional day on Xarelto-and if he does well-he should be able to go home in the next day or so.  Patient aware that he will require uninterrupted anticoagulation for at least 6 to 9 months-and before discontinuing anticoagulation-he needs a hematology evaluation.    ABG:    Component Value Date/Time   HCO3  31.3 (H) 09/19/2018 2138   O2SAT 87.6 09/19/2018 2138    Condition -Stable  Family Communication  : None at bedside-patient will update family himself.  Code Status :  Full Code  Diet :  Diet Order            Diet Heart Room service appropriate? Yes; Fluid consistency: Thin  Diet effective now               Disposition Plan  :  Remain inpatient  Consults  :  None  Procedures  :  None  DVT Prophylaxis  : Xarelto  Lab Results  Component Value Date   PLT 353 09/20/2018    Inpatient Medications  Scheduled Meds:  benzonatate  200 mg Oral TID   methylPREDNISolone (SOLU-MEDROL) injection  40 mg Intravenous Q12H   Rivaroxaban  15 mg Oral BID WC   [START ON 10/11/2018] rivaroxaban  20 mg Oral Q supper   sodium chloride (PF)       sodium chloride flush  3 mL Intravenous Q12H   vitamin C  500 mg Oral Daily   zinc sulfate  220 mg Oral Daily   Continuous Infusions:  sodium chloride 10 mL/hr at 09/20/18 0953   sodium chloride     PRN Meds:.sodium chloride, acetaminophen **OR** acetaminophen, chlorpheniramine-HYDROcodone, guaiFENesin-dextromethorphan, HYDROcodone-acetaminophen, ondansetron **OR** ondansetron (ZOFRAN) IV, sodium chloride flush  Antibiotics  :    Anti-infectives (From admission, onward)   Start     Dose/Rate Route Frequency Ordered Stop   09/19/18 2215  cefTRIAXone (ROCEPHIN) 1 g in sodium chloride 0.9 % 100 mL IVPB     1 g 200 mL/hr over 30 Minutes Intravenous  Once 09/19/18 2209 09/19/18 2255   09/19/18 2215  azithromycin (ZITHROMAX) 500 mg in sodium chloride 0.9 %  250 mL IVPB     500 mg 250 mL/hr over 60 Minutes Intravenous  Once 09/19/18 2209 09/20/18 0015       Time Spent in minutes  25   Jeoffrey MassedShanker Stepfon Rawles M.D on 09/20/2018 at 10:01 AM  To page go to [email or web address] - use universal password  [facility] -  Office  [phone]   Admit date - 09/19/2018    0    Objective:   Vitals:   09/20/18 0406 09/20/18 0430 09/20/18 0750 09/20/18 0900  BP: 136/90 (!) 143/93 134/83   Pulse: 65 66 69 68  Resp: 20 19 (!) 25 (!) 23  Temp:   98 F (36.7 C)   TempSrc:   Oral   SpO2: 99% 98% 98% 97%  Weight:      Height:        Wt Readings from Last 3 Encounters:  09/19/18 77.1 kg     Intake/Output Summary (Last 24 hours) at 09/20/2018 1001 Last data filed at 09/20/2018 0934 Gross per 24 hour  Intake 676.93 ml  Output --  Net 676.93 ml     Physical Exam Gen Exam:IAlert awake-not in any distress HEENT:atraumatic, normocephalic Chest: B/L clear to auscultation anteriorly-some scattered wheezing heard. CVS:S1S2 regular Abdomen:soft non tender, non distended Extremities:no edema Neurology: difficult to examine given sedation/intubation Skin: no rash   Data Review:    CBC Recent Labs  Lab 09/19/18 2120 09/20/18 0630  WBC 7.4 6.5  HGB 12.5* 11.5*  HCT 39.1 37.1*  PLT 334 353  MCV 94.4 95.6  MCH 30.2 29.6  MCHC 32.0 31.0  RDW 13.1 13.2  LYMPHSABS 1.1  --   MONOABS 0.7  --   EOSABS 0.1  --  BASOSABS 0.1  --     Chemistries  Recent Labs  Lab 09/19/18 2120 09/20/18 0012  NA 139  --   K 3.5  --   CL 99  --   CO2 26  --   GLUCOSE 142*  --   BUN 19  --   CREATININE 1.00  --   CALCIUM 8.8*  --   MG  --  2.1  AST  --  16  ALT  --  14  ALKPHOS  --  45  BILITOT  --  0.3   ------------------------------------------------------------------------------------------------------------------ Recent Labs    09/20/18 0012  TRIG 142    No results found for:  HGBA1C ------------------------------------------------------------------------------------------------------------------ Recent Labs    09/20/18 0630  TSH 0.788   ------------------------------------------------------------------------------------------------------------------ Recent Labs    09/20/18 0012  FERRITIN 650*    Coagulation profile Recent Labs  Lab 09/20/18 0012  INR 1.1    Recent Labs    09/20/18 0012  DDIMER >20.00*    Cardiac Enzymes Recent Labs  Lab 09/20/18 0012  TROPONINI <0.03   ------------------------------------------------------------------------------------------------------------------    Component Value Date/Time   BNP 32.8 09/20/2018 0012    Micro Results Recent Results (from the past 240 hour(s))  SARS Coronavirus 2 (CEPHEID - Performed in [place] hospital lab), Hosp Order     Status: Abnormal   Collection Time: 09/19/18 11:25 PM   Specimen: Nasopharyngeal Swab  Result Value Ref Range Status   SARS Coronavirus 2 POSITIVE (A) NEGATIVE Final    Comment: CRITICAL RESULT CALLED TO, READ BACK BY AND VERIFIED WITH: RN Lenna Sciara [facility] AT 0209 09/20/18 CRUICKSHANK A (NOTE) If result is NEGATIVE SARS-CoV-2 target nucleic acids are NOT DETECTED. The SARS-CoV-2 RNA is generally detectable in upper and lower  respiratory specimens during the acute phase of infection. The lowest  concentration of SARS-CoV-2 viral copies this assay can detect is 250  copies / mL. A negative result does not preclude SARS-CoV-2 infection  and should not be used as the sole basis for treatment or other  patient management decisions.  A negative result may occur with  improper specimen collection / handling, submission of specimen other  than nasopharyngeal swab, presence of viral mutation(s) within the  areas targeted by this assay, and inadequate number of viral copies  (<250 copies / mL). A negative result must be combined with clinical  observations, patient  history, and epidemiological information. If result is POSITIVE SARS-CoV-2 target nucleic acids  are DETECTED. The SARS-CoV-2 RNA is generally detectable in upper and lower  respiratory specimens during the acute phase of infection.  Positive  results are indicative of active infection with SARS-CoV-2.  Clinical  correlation with patient history and other diagnostic information is  necessary to determine patient infection status.  Positive results do  not rule out bacterial infection or co-infection with other viruses. If result is PRESUMPTIVE POSTIVE SARS-CoV-2 nucleic acids MAY BE PRESENT.   A presumptive positive result was obtained on the submitted specimen  and confirmed on repeat testing.  While 2019 novel coronavirus  (SARS-CoV-2) nucleic acids may be present in the submitted sample  additional confirmatory testing may be necessary for epidemiological  and / or clinical management purposes  to differentiate between  SARS-CoV-2 and other Sarbecovirus currently known to infect humans.  If clinically indicated additional testing with an alternate test  [facility] [phone]) is advised. The SARS-CoV-2 RNA is generally  detectable in upper and lower respiratory specimens during the acute  phase of infection.  The expected result is Negative. Fact Sheet for Patients:  BoilerBrush.com.cyhttps://www.fda.gov/media/136312/download Fact Sheet for Healthcare Providers: https://pope.com/https://www.fda.gov/media/136313/download This test is not yet approved or cleared by the [place] FDA and has been authorized for detection and/or diagnosis of SARS-CoV-2 by FDA under an Emergency Use Authorization (EUA).  This EUA will remain in effect (meaning this test can be used) for the duration of the COVID-19 declaration under Section 564(b)(1) of the Act, 21 U.S.C. section 360bbb-3(b)(1), unless the authorization is terminated or revoked sooner. Performed at [facility], [address] [place],  [place] [id number]     Radiology Reports Ct Chest W Contrast  Addendum Date: 09/19/2018   ADDENDUM REPORT: 09/19/2018 16:09 ADDENDUM: Critical Value/emergent results were called by telephone at the time of interpretation on 09/19/2018 at 4:09 pm to Dr. Charlesetta ShanksMARIA IBARRA , who verbally acknowledged these results. Electronically Signed   By: Elberta Fortisaniel  Boyle M.D.   On: 09/19/2018 16:09   Result Date: 09/19/2018 CLINICAL DATA:  Abnormal chest x-ray at physician's office. Two weeks of cough which shortness-of-breath and reports mold exposure at home. EXAM: CT CHEST WITH CONTRAST TECHNIQUE: Multidetector CT imaging of the chest was performed during intravenous contrast administration. CONTRAST:  75mL ISOVUE-370 IOPAMIDOL (ISOVUE-370) INJECTION 76% COMPARISON:  Chest x-ray 09/16/2018 FINDINGS: Cardiovascular: Heart is normal size. Thoracic aorta is within normal. Although this study is not optimized for contrast opacification of the pulmonary arteries, there are findings suggesting right-sided pulmonary emboli. Remaining vascular structures are unremarkable. Mediastinum/Nodes: No mediastinal or hilar adenopathy. Remaining mediastinal structures are unremarkable. Subcentimeter left thyroid hypodense nodule. Lungs/Pleura: Lungs are adequately inflated and demonstrate bilateral patchy peripheral airspace opacification worse over the mid to lower lungs. No evidence of effusion. Airways are normal. Upper Abdomen: No acute findings. Musculoskeletal: No acute findings. IMPRESSION: 1. Patchy bilateral peripheral airspace process over the mid to lower lungs. Findings likely due to multifocal pneumonia which may be of bacterial or possible viral origin. Recommend follow-up to resolution. 2. Although this exam is not optimized for pulmonary artery opacification, there are findings suggesting several right-sided pulmonary emboli. Recommend CTA of the chest for further evaluation. Electronically Signed: By: Elberta Fortisaniel  Boyle M.D. On: 09/19/2018  15:48   Ct Angio Chest Pe W/cm &/or Wo Cm  Addendum Date: 09/19/2018   ADDENDUM REPORT: 09/19/2018 23:32 ADDENDUM: Critical Value/emergent results were called by telephone at the time of interpretation on 09/19/2018 at 2328 hours to Dr. Tommas Olphris Polina in the ED who verbally acknowledged these results. Electronically Signed   By: Odessa FlemingH  Hall M.D.   On: 09/19/2018 23:32   Result Date: 09/19/2018 CLINICAL DATA:  58 year old male with abnormal chest CT earlier today. Shortness of breath. Pending COVID-19 testing. EXAM: CT ANGIOGRAPHY CHEST WITH CONTRAST TECHNIQUE: Multidetector CT imaging of the chest was performed using the standard protocol during bolus administration of intravenous contrast. Multiplanar CT image reconstructions and MIPs were obtained to evaluate the vascular anatomy. CONTRAST:  80mL OMNIPAQUE IOHEXOL 350 MG/ML SOLN COMPARISON:  Chest CT 1518 hours. FINDINGS: Cardiovascular: Good contrast bolus timing in the pulmonary arterial tree. No pulmonary artery filling defect identified in the left lung, but there is pulmonary artery clot at the branching of the right main pulmonary artery extending into the right upper lobe (series 5, image 141, right middle lobe (image 167), and right lower lobe (image 180). Much of the visible thrombus is non occlusive. There is some occlusive thrombus suspected in the posterior basal segment of the right lower lobe. There is some occlusive thrombus  visible in the subsegmental branches to the medial segment of the right middle lobe. No central or saddle embolus. Stable cardiac size at the upper limits of normal. No pericardial effusion. Negative visible aorta. Mediastinum/Nodes: Negative, no lymphadenopathy. Lungs/Pleura: Major airways remain patent. Confluent but indistinct bilateral peripheral lower lobe, left upper lobe and lingula opacity redemonstrated and this does not directly correspond to areas of pulmonary emboli. No pleural effusion. Upper Abdomen: Negative  visible liver, spleen, pancreas, adrenal glands, kidneys, and bowel in the upper abdomen. Musculoskeletal: No acute osseous abnormality identified. Review of the MIP images confirms the above findings. IMPRESSION: 1. Confirmed acute pulmonary emboli with lobar involvement throughout the right lung. However, much of the visible thrombus is nonocclusive, and does not appear to directly correspond to the abnormal pulmonary opacity. 2. Continued superimposed bilateral pulmonary opacity in a pattern suspicious for COVID-19 pneumonia. No pleural effusion. Electronically Signed: By: Odessa FlemingH  Hall M.D. On: 09/19/2018 23:25

## 2018-09-20 NOTE — Discharge Instructions (Addendum)
Person Under Monitoring Name: Derek Norris  Location: [place] [address]   Infection Prevention Recommendations for Individuals Confirmed to have, or Being Evaluated for, 2019 Novel Coronavirus (COVID-19) Infection Who Receive Care at Home  Individuals who are confirmed to have, or are being evaluated for, COVID-19 should follow the prevention steps below until a healthcare provider or local or state health department says they can return to normal activities.  Stay home except to get medical care You should restrict activities outside your home, except for getting medical care. Do not go to work, school, or public areas, and do not use public transportation or taxis.  Call ahead before visiting your doctor Before your medical appointment, call the healthcare provider and tell them that you have, or are being evaluated for, COVID-19 infection. This will help the healthcare providers office take steps to keep other people from getting infected. Ask your healthcare provider to call the local or state health department.  Monitor your symptoms Seek prompt medical attention if your illness is worsening (e.g., difficulty breathing). Before going to your medical appointment, call the healthcare provider and tell them that you have, or are being evaluated for, COVID-19 infection. Ask your healthcare provider to call the local or state health department.  Wear a facemask You should wear a facemask that covers your nose and mouth when you are in the same room with other people and when you visit a healthcare provider. People who live with or visit you should also wear a facemask while they are in the same room with you.  Separate yourself from other people in your home As much as possible, you should stay in a different room from other people in your home. Also, you should use a separate bathroom, if available.  Avoid sharing household items You should not share  dishes, drinking glasses, cups, eating utensils, towels, bedding, or other items with other people in your home. After using these items, you should wash them thoroughly with soap and water.  Cover your coughs and sneezes Cover your mouth and nose with a tissue when you cough or sneeze, or you can cough or sneeze into your sleeve. Throw used tissues in a lined trash can, and immediately wash your hands with soap and water for at least 20 seconds or use an alcohol-based hand rub.  Wash your [facility] your hands often and thoroughly with soap and water for at least 20 seconds. You can use an alcohol-based hand sanitizer if soap and water are not available and if your hands are not visibly dirty. Avoid touching your eyes, nose, and mouth with unwashed hands.   Prevention Steps for Caregivers and Household Members of Individuals Confirmed to have, or Being Evaluated for, COVID-19 Infection Being Cared for in the Home  If you live with, or provide care at home for, a person confirmed to have, or being evaluated for, COVID-19 infection please follow these guidelines to prevent infection:  Follow healthcare providers instructions Make sure that you understand and can help the patient follow any healthcare provider instructions for all care.  Provide for the patients basic needs You should help the patient with basic needs in the home and provide support for getting groceries, prescriptions, and other personal needs.  Monitor the patients symptoms If they are getting sicker, call his or her medical provider and tell them that the patient has, or is being evaluated for, COVID-19 infection. This will help the healthcare providers office take  steps to keep other people from getting infected. Ask the healthcare provider to call the local or state health department.  Limit the number of people who have contact with the patient  If possible, have only one caregiver for the patient.  Other  household members should stay in another home or place of residence. If this is not possible, they should stay  in another room, or be separated from the patient as much as possible. Use a separate bathroom, if available.  Restrict visitors who do not have an essential need to be in the home.  Keep older adults, very young children, and other sick people away from the patient Keep older adults, very young children, and those who have compromised immune systems or chronic health conditions away from the patient. This includes people with chronic heart, lung, or kidney conditions, diabetes, and cancer.  Ensure good ventilation Make sure that shared spaces in the home have good air flow, such as from an air conditioner or an opened window, weather permitting.  Wash your hands often  Wash your hands often and thoroughly with soap and water for at least 20 seconds. You can use an alcohol based hand sanitizer if soap and water are not available and if your hands are not visibly dirty.  Avoid touching your eyes, nose, and mouth with unwashed hands.  Use disposable paper towels to dry your hands. If not available, use dedicated cloth towels and replace them when they become wet.  Wear a facemask and gloves  Wear a disposable facemask at all times in the room and gloves when you touch or have contact with the patients blood, body fluids, and/or secretions or excretions, such as sweat, saliva, sputum, nasal mucus, vomit, urine, or feces.  Ensure the mask fits over your nose and mouth tightly, and do not touch it during use.  Throw out disposable facemasks and gloves after using them. Do not reuse.  Wash your hands immediately after removing your facemask and gloves.  If your personal clothing becomes contaminated, carefully remove clothing and launder. Wash your hands after handling contaminated clothing.  Place all used disposable facemasks, gloves, and other waste in a lined container before  disposing them with other household waste.  Remove gloves and wash your hands immediately after handling these items.  Do not share dishes, glasses, or other household items with the patient  Avoid sharing household items. You should not share dishes, drinking glasses, cups, eating utensils, towels, bedding, or other items with a patient who is confirmed to have, or being evaluated for, COVID-19 infection.  After the person uses these items, you should wash them thoroughly with soap and water.  Wash laundry thoroughly  Immediately remove and wash clothes or bedding that have blood, body fluids, and/or secretions or excretions, such as sweat, saliva, sputum, nasal mucus, vomit, urine, or feces, on them.  Wear gloves when handling laundry from the patient.  Read and follow directions on labels of laundry or clothing items and detergent. In general, wash and dry with the warmest temperatures recommended on the label.  Clean all areas the individual has used often  Clean all touchable surfaces, such as counters, tabletops, doorknobs, bathroom fixtures, toilets, phones, keyboards, tablets, and bedside tables, every day. Also, clean any surfaces that may have blood, body fluids, and/or secretions or excretions on them.  Wear gloves when cleaning surfaces the patient has come in contact with.  Use a diluted bleach solution (e.g., dilute bleach with 1 part bleach  and 10 parts water) or a household disinfectant with a label that says EPA-registered for coronaviruses. To make a bleach solution at home, add 1 tablespoon of bleach to 1 quart (4 cups) of water. For a larger supply, add  cup of bleach to 1 gallon (16 cups) of water.  Read labels of cleaning products and follow recommendations provided on product labels. Labels contain instructions for safe and effective use of the cleaning product including precautions you should take when applying the product, such as wearing gloves or eye protection  and making sure you have good ventilation during use of the product.  Remove gloves and wash hands immediately after cleaning.  Monitor yourself for signs and symptoms of illness Caregivers and household members are considered close contacts, should monitor their health, and will be asked to limit movement outside of the home to the extent possible. Follow the monitoring steps for close contacts listed on the symptom monitoring form.   ? If you have additional questions, contact your local health department or call the epidemiologist on call at [phone] (available 24/7). ? This guidance is subject to change. For the most up-to-date guidance from [facility], please refer to their website: TripMetro.huhttps://www.cdc.gov/coronavirus/2019-ncov/hcp/guidance-prevent-spread.html       Person Under Monitoring Name: Derek GrumblingCedric Norris  Location: [address] [place] [id number]   CORONAVIRUS DISEASE 2019 (COVID-19) Guidance for Persons Under Investigation You are being tested for the virus that causes coronavirus disease 2019 (COVID-19). Public health actions are necessary to ensure protection of your health and the health of others, and to prevent further spread of infection. COVID-19 is caused by a virus that can cause symptoms, such as fever, cough, and shortness of breath. The primary transmission from person to person is by coughing or sneezing. On May 09, 2018, the World Health Organization announced a [employer] Emergency of International Concern and on May 10, 2018 the U.S. Department of Health and Human Services declared a public health emergency. If the virus that causesCOVID-19 spreads in the community, it could have severe public health consequences.  As a person under investigation for COVID-19, the [place] Department of Health and [employer], Division of [employer] advises you to adhere to the following guidance until your test results are reported to you. If your test  result is positive, you will receive additional information from your provider and your local health department at that time.   Remain at home until you are cleared by your health provider or public health authorities.   Keep a log of visitors to your home using the form provided. Any visitors to your home must be aware of your isolation status.  If you plan to move to a new address or leave the county, notify the local health department in your county.  Call a doctor or seek care if you have an urgent medical need. Before seeking medical care, call ahead and get instructions from the provider before arriving at the medical office, clinic or hospital. Notify them that you are being tested for the virus that causes COVID-19 so arrangements can be made, as necessary, to prevent transmission to others in the healthcare setting. Next, notify the local health department in your county.  If a medical emergency arises and you need to call 911, inform the first responders that you are being tested for the virus that causes COVID-19. Next, notify the local health department in your county.  Adhere to all guidance set forth by the [place] [employer] Division of  Public Health for Home Care of patients that is based on guidance from the Center for Disease Control and Prevention with suspected or confirmed COVID-19. It is provided with this guidance for Persons Under Investigation.  Your health and the health of our community are our top priorities. Public Health officials remain available to provide assistance and counseling to you about COVID-19 and compliance with this guidance.  Provider: ____________________________________________________________ Date: ______/_____/_________  By signing below, you acknowledge that you have read and agree to comply with this Guidance for Persons Under Investigation. ______________________________________________________________ Date: ______/_____/_________  WHO DO I  CALL? You can find a list of local health departments here: http://dean.org/https://www.ncdhhs.gov/divisions/public-health/county-healthdepartments Health Department: ____________________________________________________________________ Contact Name: ________________________________________________________________________ Telephone: ___________________________________________________________________________  [facility] [place] DHHS, Division of Public Health, Communicable Disease Branch COVID-19 Guidance for Persons Under Investigation June 15, 2018     Information on my medicine - XARELTO (rivaroxaban)  This medication education was reviewed with me or my healthcare representative as part of my discharge preparation.   WHY WAS XARELTO PRESCRIBED FOR YOU? Xarelto was prescribed to treat blood clots that may have been found in the veins of your legs (deep vein thrombosis) or in your lungs (pulmonary embolism) and to reduce the risk of them occurring again.  What do you need to know about Xarelto? The starting dose is one 15 mg tablet taken TWICE daily with food for the FIRST 21 DAYS then on (enter date)  10/11/2018  the dose is changed to one 20 mg tablet taken ONCE A DAY with your evening meal.  DO NOT stop taking Xarelto without talking to the health care provider who prescribed the medication.  Refill your prescription for 20 mg tablets before you run out.  After discharge, you should have regular check-up appointments with your healthcare provider that is prescribing your Xarelto.  In the future your dose may need to be changed if your kidney function changes by a significant amount.  What do you do if you miss a dose? If you are taking Xarelto TWICE DAILY and you miss a dose, take it as soon as you remember. You may take two 15 mg tablets (total 30 mg) at the same time then resume your regularly scheduled 15 mg twice daily the next day.  If you are taking Xarelto ONCE DAILY and you miss a dose, take  it as soon as you remember on the same day then continue your regularly scheduled once daily regimen the next day. Do not take two doses of Xarelto at the same time.   Important Safety Information Xarelto is a blood thinner medicine that can cause bleeding. You should call your healthcare provider right away if you experience any of the following: ? Bleeding from an injury or your nose that does not stop. ? Unusual colored urine (red or dark brown) or unusual colored stools (red or black). ? Unusual bruising for unknown reasons. ? A serious fall or if you hit your head (even if there is no bleeding).  Some medicines may interact with Xarelto and might increase your risk of bleeding while on Xarelto. To help avoid this, consult your healthcare provider or pharmacist prior to using any new prescription or non-prescription medications, including herbals, vitamins, non-steroidal anti-inflammatory drugs (NSAIDs) and supplements.  This website has more information on Xarelto: [email or web address].brwww.xarelto.[email or web address].

## 2018-09-20 NOTE — Progress Notes (Signed)
Bilateral lower extremity venous duplex has been completed. Preliminary results can be found in CV Proc through chart review.  Results were given to the patient's nurse, Vinnie Level.  09/20/18 10:05 AM  Carlos Levering RVT

## 2018-09-20 NOTE — ED Notes (Signed)
ED TO INPATIENT HANDOFF REPORT  Name/Age/Gender Derek Norris 58 y.o. male  Code Status   Home/SNF/Other Home  Chief Complaint Abnormal Lab (sent by doctor)  Level of Care/Admitting Diagnosis ED Disposition    ED Disposition Condition Comment   Admit  Hospital Area: [facility] [[id number]]  Level of Care: Telemetry [5]  Covid Evaluation: Confirmed COVID Positive  Isolation Risk Level: Low Risk/Droplet (Less than 4L [place] supplementation)  Diagnosis: Pneumonia due to COVID-19 virus [[id number]]  Admitting Physician: Therisa Doyne [[id number]]  Attending Physician: Therisa Doyne [[id number]]  PT Class (Do Not Modify): Observation [104]  PT Acc Code (Do Not Modify): Observation [10022]       Medical History History reviewed. No pertinent past medical history.  Allergies No Known Allergies  IV Location/Drains/Wounds Patient Lines/Drains/Airways Status   Active Line/Drains/Airways    Name:   Placement date:   Placement time:   Site:   Days:   Peripheral IV 09/19/18 Left Forearm   09/19/18    2120    Forearm   1          Labs/Imaging Results for orders placed or performed during the hospital encounter of 09/19/18 (from the past 48 hour(s))  Basic metabolic panel     Status: Abnormal   Collection Time: 09/19/18  9:20 PM  Result Value Ref Range   Sodium 139 135 - 145 mmol/L   Potassium 3.5 3.5 - 5.1 mmol/L   Chloride 99 98 - 111 mmol/L   CO2 26 22 - 32 mmol/L   Glucose, Bld 142 (H) 70 - 99 mg/dL   BUN 19 6 - 20 mg/dL   Creatinine, Ser 0.98 0.61 - 1.24 mg/dL   Calcium 8.8 (L) 8.9 - 10.3 mg/dL   GFR calc non Af Amer >60 >60 mL/min   GFR calc Af Amer >60 >60 mL/min   Anion gap 14 5 - 15    Comment: Performed at [facility], [address] [place], [place] [address]  CBC with Differential     Status: Abnormal   Collection Time: 09/19/18  9:20 PM  Result Value Ref Range   WBC 7.4 4.0 - 10.5 K/uL   RBC 4.14 (L) 4.22 - 5.81 MIL/uL    Hemoglobin 12.5 (L) 13.0 - 17.0 g/dL   HCT 78.2 95.6 - 21.3 %   MCV 94.4 80.0 - 100.0 fL   MCH 30.2 26.0 - 34.0 pg   MCHC 32.0 30.0 - 36.0 g/dL   RDW 08.6 57.8 - 46.9 %   Platelets 334 150 - 400 K/uL   nRBC 0.0 0.0 - 0.2 %   Neutrophils Relative % 71 %   Neutro Abs 5.3 1.7 - 7.7 K/uL   Lymphocytes Relative 15 %   Lymphs Abs 1.1 0.7 - 4.0 K/uL   Monocytes Relative 10 %   Monocytes Absolute 0.7 0.1 - 1.0 K/uL   Eosinophils Relative 2 %   Eosinophils Absolute 0.1 0.0 - 0.5 K/uL   Basophils Relative 1 %   Basophils Absolute 0.1 0.0 - 0.1 K/uL   Immature Granulocytes 1 %   Abs Immature Granulocytes 0.09 (H) 0.00 - 0.07 K/uL    Comment: Performed at [facility], [address] [place], [place] [address]  Blood gas, venous     Status: Abnormal   Collection Time: 09/19/18  9:38 PM  Result Value Ref Range   pH, Ven 7.459 (H) 7.250 - 7.430   pCO2, Ven 44.8 44.0 - 60.0  mmHg   pO2, Ven 56.7 (H) 32.0 - 45.0 mmHg   Bicarbonate 31.3 (H) 20.0 - 28.0 mmol/L   Acid-Base Excess 7.0 (H) 0.0 - 2.0 mmol/L   O2 Saturation 87.6 %   Patient temperature 98.6    Collection site VEIN    Drawn by COLLECTED BY NURSE    Sample type VEIN     Comment: Performed at [facility], [address], [place], [place] [address]  Lactic acid, plasma     Status: None   Collection Time: 09/19/18  9:38 PM  Result Value Ref Range   Lactic Acid, Venous 1.0 0.5 - 1.9 mmol/L    Comment: Performed at [facility], [address], [place], [place] [address]  Lactic acid, plasma     Status: None   Collection Time: 09/19/18 11:25 PM  Result Value Ref Range   Lactic Acid, Venous 1.2 0.5 - 1.9 mmol/L    Comment: Performed at [facility], [address], [place], [place] [address]  SARS Coronavirus 2 (CEPHEID - Performed in [facility] hospital lab), Hosp Order     Status: Abnormal   Collection Time: 09/19/18 11:25 PM   Specimen: Nasopharyngeal  Swab  Result Value Ref Range   SARS Coronavirus 2 POSITIVE (A) NEGATIVE    Comment: CRITICAL RESULT CALLED TO, READ BACK BY AND VERIFIED WITH: RN Shela Commons [facility] AT 0209 09/20/18 CRUICKSHANK A (NOTE) If result is NEGATIVE SARS-CoV-2 target nucleic acids are NOT DETECTED. The SARS-CoV-2 RNA is generally detectable in upper and lower  respiratory specimens during the acute phase of infection. The lowest  concentration of SARS-CoV-2 viral copies this assay can detect is 250  copies / mL. A negative result does not preclude SARS-CoV-2 infection  and should not be used as the sole basis for treatment or other  patient management decisions.  A negative result may occur with  improper specimen collection / handling, submission of specimen other  than nasopharyngeal swab, presence of viral mutation(s) within the  areas targeted by this assay, and inadequate number of viral copies  (<250 copies / mL). A negative result must be combined with clinical  observations, patient history, and epidemiological information. If result is POSITIVE SARS-CoV-2 target nucleic acids  are DETECTED. The SARS-CoV-2 RNA is generally detectable in upper and lower  respiratory specimens during the acute phase of infection.  Positive  results are indicative of active infection with SARS-CoV-2.  Clinical  correlation with patient history and other diagnostic information is  necessary to determine patient infection status.  Positive results do  not rule out bacterial infection or co-infection with other viruses. If result is PRESUMPTIVE POSTIVE SARS-CoV-2 nucleic acids MAY BE PRESENT.   A presumptive positive result was obtained on the submitted specimen  and confirmed on repeat testing.  While 2019 novel coronavirus  (SARS-CoV-2) nucleic acids may be present in the submitted sample  additional confirmatory testing may be necessary for epidemiological  and / or clinical management purposes  to differentiate between   SARS-CoV-2 and other Sarbecovirus currently known to infect humans.  If clinically indicated additional testing with an alternate test  [facility] [phone]) is advised. The SARS-CoV-2 RNA is generally  detectable in upper and lower respiratory specimens during the acute  phase of infection. The expected result is Negative. Fact Sheet for Patients:  BoilerBrush.com.cy Fact Sheet for Healthcare Providers: https://pope.com/ This test is not yet approved or cleared by the [place] FDA and has been  authorized for detection and/or diagnosis of SARS-CoV-2 by FDA under an Emergency Use Authorization (EUA).  This EUA will remain in effect (meaning this test can be used) for the duration of the COVID-19 declaration under Section 564(b)(1) of the Act, 21 U.S.C. section 360bbb-3(b)(1), unless the authorization is terminated or revoked sooner. Performed at [facility], [address] [place], [place] [address]   Urinalysis, Routine w reflex microscopic     Status: Abnormal   Collection Time: 09/20/18 12:00 AM  Result Value Ref Range   Color, Urine YELLOW YELLOW   APPearance CLEAR CLEAR   Specific Gravity, Urine >1.046 (H) 1.005 - 1.030   pH 5.0 5.0 - 8.0   Glucose, UA 50 (A) NEGATIVE mg/dL   Hgb urine dipstick NEGATIVE NEGATIVE   Bilirubin Urine NEGATIVE NEGATIVE   Ketones, ur 5 (A) NEGATIVE mg/dL   Protein, ur 30 (A) NEGATIVE mg/dL   Nitrite NEGATIVE NEGATIVE   Leukocytes,Ua NEGATIVE NEGATIVE   RBC / HPF 0-5 0 - 5 RBC/hpf   WBC, UA 0-5 0 - 5 WBC/hpf   Bacteria, UA NONE SEEN NONE SEEN   Mucus PRESENT     Comment: Performed at [facility], [address] [place], [place] [address]  Brain natriuretic peptide     Status: None   Collection Time: 09/20/18 12:12 AM  Result Value Ref Range   B Natriuretic Peptide 32.8 0.0 - 100.0 pg/mL    Comment: Performed at [facility], [address] [place], [place] [address]  Troponin I - Add-On to previous collection     Status: None   Collection Time: 09/20/18 12:12 AM  Result Value Ref Range   Troponin I <0.03 <0.03 ng/mL    Comment: Performed at [facility], [address] [place], [place] [address]  Hepatic function panel     Status: Abnormal   Collection Time: 09/20/18 12:12 AM  Result Value Ref Range   Total Protein 6.8 6.5 - 8.1 g/dL   Albumin 2.9 (L) 3.5 - 5.0 g/dL   AST 16 15 - 41 U/L   ALT 14 0 - 44 U/L   Alkaline Phosphatase 45 38 - 126 U/L   Total Bilirubin 0.3 0.3 - 1.2 mg/dL   Bilirubin, Direct 0.2 0.0 - 0.2 mg/dL   Indirect Bilirubin 0.1 (L) 0.3 - 0.9 mg/dL    Comment: Performed at [facility], [address] [place], [place] [address]  CK     Status: None   Collection Time: 09/20/18 12:12 AM  Result Value Ref Range   Total CK 78 49 - 397 U/L    Comment: Performed at [facility] [place] [facility], [address] [place], [place] [address]  Magnesium     Status: None   Collection Time: 09/20/18 12:12 AM  Result Value Ref Range   Magnesium 2.1 1.7 - 2.4 mg/dL    Comment: Performed at [facility], [address] [place], [place] [address]  Protime-INR     Status: None   Collection Time: 09/20/18 12:12 AM  Result Value Ref Range   Prothrombin Time 14.5 11.4 - 15.2 seconds   INR 1.1 0.8 - 1.2    Comment: (NOTE) INR goal varies based on device and disease states. Performed at [facility], [address] [place], [place] [address]   Procalcitonin - Baseline     Status: None   Collection Time: 09/20/18 12:12 AM  Result Value  Ref Range   Procalcitonin 0.15 ng/mL    Comment:        Interpretation: PCT (Procalcitonin) <= 0.5 ng/mL: Systemic infection (sepsis) is not likely. Local bacterial infection is possible. (NOTE)       Sepsis PCT Algorithm           Lower Respiratory Tract                                       Infection PCT Algorithm    ----------------------------     ----------------------------         PCT < 0.25 ng/mL                PCT < 0.10 ng/mL         Strongly encourage             Strongly discourage   discontinuation of antibiotics    initiation of antibiotics    ----------------------------     -----------------------------       PCT 0.25 - 0.50 ng/mL            PCT 0.10 - 0.25 ng/mL               OR       >80% decrease in PCT            Discourage initiation of                                            antibiotics      Encourage discontinuation           of antibiotics    ----------------------------     -----------------------------         PCT >= 0.50 ng/mL              PCT 0.26 - 0.50 ng/mL               AND        <80% decrease in PCT             Encourage initiation of                                             antibiotics       Encourage continuation           of antibiotics    ----------------------------     -----------------------------        PCT >= 0.50 ng/mL                  PCT > 0.50 ng/mL               AND         increase in PCT                  Strongly encourage                                      initiation of antibiotics    Strongly encourage escalation           of  antibiotics                                     -----------------------------                                           PCT <= 0.25 ng/mL                                                 OR                                        > 80% decrease in PCT                                     Discontinue / Do not initiate                                             antibiotics Performed at [facility], [address], [place], [place] [address]   D-dimer, quantitative     Status: Abnormal   Collection Time: 09/20/18 12:12 AM  Result Value Ref Range   D-Dimer, Quant >20.00 (H) 0.00 - 0.50 ug/mL-FEU    Comment: REPEATED TO VERIFY SPECIMEN CHECKED FOR CLOTS (NOTE) At  the manufacturer cut-off of 0.50 ug/mL FEU, this assay has been documented to exclude PE with a sensitivity and negative predictive value of 97 to 99%.  At this time, this assay has not been approved by the FDA to exclude DVT/VTE. Results should be correlated with clinical presentation. Performed at [facility], [address], [place], [place] [address]   Lactate dehydrogenase     Status: None   Collection Time: 09/20/18 12:12 AM  Result Value Ref Range   LDH 178 98 - 192 U/L    Comment: Performed at [facility], [address] [place], [place] [address]  Ferritin     Status: Abnormal   Collection Time: 09/20/18 12:12 AM  Result Value Ref Range   Ferritin 650 (H) 24 - 336 ng/mL    Comment: Performed at [facility], [address] [place], [place] [address]  Triglycerides     Status: None   Collection Time: 09/20/18 12:12 AM  Result Value Ref Range   Triglycerides 142 <150 mg/dL    Comment: Performed at [facility], [address] [place], [place] [address]  Fibrinogen     Status: Abnormal   Collection Time: 09/20/18 12:12 AM  Result Value Ref Range   Fibrinogen 721 (H) 210 - 475 mg/dL    Comment: Performed at [facility], [address] [place] [address], [place], [place] [address]  C-reactive protein     Status: Abnormal   Collection Time: 09/20/18 12:12 AM  Result Value Ref Range   CRP 8.4 (H) <1.0 mg/dL    Comment: Performed at  [facility], [address], [place], [place] [address]  Sedimentation rate     Status: Abnormal   Collection Time: 09/20/18 12:14 AM  Result Value Ref Range   Sed Rate 81 (H) 0 - 16 mm/hr    Comment: Performed at [facility], [address] [place], [place] [address]  APTT     Status: None   Collection Time: 09/20/18 12:14 AM  Result Value Ref Range   aPTT 36 24 - 36 seconds    Comment: Performed at [facility], [address] [place], [place] [address]   Ct Chest W Contrast  Addendum Date: 09/19/2018   ADDENDUM REPORT: 09/19/2018 16:09 ADDENDUM: Critical Value/emergent results were called by telephone at the time of interpretation on 09/19/2018 at 4:09 pm to Dr. Charlesetta Shanks , who verbally acknowledged these results. Electronically Signed   By: Elberta Fortis M.D.   On: 09/19/2018 16:09   Result Date: 09/19/2018 CLINICAL DATA:  Abnormal chest x-ray at physician's office. Two weeks of cough which shortness-of-breath and reports mold exposure at home. EXAM: CT CHEST WITH CONTRAST TECHNIQUE: Multidetector CT imaging of the chest was performed during intravenous contrast administration. CONTRAST:  75mL ISOVUE-370 IOPAMIDOL (ISOVUE-370) INJECTION 76% COMPARISON:  Chest x-ray 09/16/2018 FINDINGS: Cardiovascular: Heart is normal size. Thoracic aorta is within normal. Although this study is not optimized for contrast opacification of the pulmonary arteries, there are findings suggesting right-sided pulmonary emboli. Remaining vascular structures are unremarkable. Mediastinum/Nodes: No mediastinal or hilar adenopathy. Remaining mediastinal structures are unremarkable. Subcentimeter left thyroid hypodense nodule. Lungs/Pleura: Lungs are adequately inflated and demonstrate bilateral patchy peripheral airspace opacification worse over the mid to lower lungs. No evidence of effusion. Airways are normal. Upper Abdomen: No acute findings. Musculoskeletal: No acute findings. IMPRESSION: 1. Patchy bilateral peripheral airspace process over the mid to lower lungs. Findings likely due to multifocal pneumonia which may be of bacterial or possible viral origin. Recommend follow-up to resolution. 2. Although this exam is not optimized for pulmonary artery opacification, there are findings suggesting several right-sided pulmonary emboli. Recommend CTA of the chest for further evaluation. Electronically Signed: By: Elberta Fortis  M.D. On: 09/19/2018 15:48   Ct Angio Chest Pe W/cm &/or Wo Cm  Addendum Date: 09/19/2018   ADDENDUM REPORT: 09/19/2018 23:32 ADDENDUM: Critical Value/emergent results were called by telephone at the time of interpretation on 09/19/2018 at 2328 hours to Dr. Tommas Olp in the ED who verbally acknowledged these results. Electronically Signed   By: Odessa Fleming M.D.   On: 09/19/2018 23:32   Result Date: 09/19/2018 CLINICAL DATA:  58 year old male with abnormal chest CT earlier today. Shortness of breath. Pending COVID-19 testing. EXAM: CT ANGIOGRAPHY CHEST WITH CONTRAST TECHNIQUE: Multidetector CT imaging of the chest was performed using the standard protocol during bolus administration of intravenous contrast. Multiplanar CT image reconstructions and MIPs were obtained to evaluate the vascular anatomy. CONTRAST:  80mL OMNIPAQUE IOHEXOL 350 MG/ML SOLN COMPARISON:  Chest CT 1518 hours. FINDINGS: Cardiovascular: Good contrast bolus timing in the pulmonary arterial tree. No pulmonary artery filling defect identified in the left lung, but there is pulmonary artery clot at the branching of the right main pulmonary artery extending into the right upper lobe (series 5, image 141, right middle lobe (image 167), and right lower lobe (image 180). Much of the visible thrombus is non occlusive. There is some occlusive thrombus suspected in the posterior basal segment of the right lower lobe. There is some occlusive  thrombus visible in the subsegmental branches to the medial segment of the right middle lobe. No central or saddle embolus. Stable cardiac size at the upper limits of normal. No pericardial effusion. Negative visible aorta. Mediastinum/Nodes: Negative, no lymphadenopathy. Lungs/Pleura: Major airways remain patent. Confluent but indistinct bilateral peripheral lower lobe, left upper lobe and lingula opacity redemonstrated and this does not directly correspond to areas of pulmonary emboli. No pleural effusion. Upper  Abdomen: Negative visible liver, spleen, pancreas, adrenal glands, kidneys, and bowel in the upper abdomen. Musculoskeletal: No acute osseous abnormality identified. Review of the MIP images confirms the above findings. IMPRESSION: 1. Confirmed acute pulmonary emboli with lobar involvement throughout the right lung. However, much of the visible thrombus is nonocclusive, and does not appear to directly correspond to the abnormal pulmonary opacity. 2. Continued superimposed bilateral pulmonary opacity in a pattern suspicious for COVID-19 pneumonia. No pleural effusion. Electronically Signed: By: Genevie Ann M.D. On: 09/19/2018 23:25    Pending Labs Unresulted Labs (From admission, onward)    Start     Ordered   09/20/18 0800  Heparin level (unfractionated)  Once-Timed,   STAT     09/20/18 0127   09/20/18 0800  CBC  Daily,   R     09/20/18 0127   09/20/18 0500  Procalcitonin  Daily,   R     09/19/18 2359   09/20/18 0500  ABO/Rh  Once,   STAT     09/20/18 0011   09/20/18 0100  Interleukin-6, Plasma  Once,   R     09/20/18 0100   09/20/18 0001  Troponin I - Now Then Q6H  Now then every 6 hours,   R (with STAT occurrences)     09/20/18 0000   09/20/18 0000  Lactic acid, plasma  Now then every 2 hours,   STAT     09/20/18 0000   09/20/18 0000  Blood Culture (routine x 2)  BLOOD CULTURE X 2,   STAT     09/20/18 0000   Signed and Held  HIV antibody (Routine Screening)  Tomorrow morning,   R     Signed and Held   Signed and Held  Culture, sputum-assessment  Once,   R    Question:  Patient immune status  Answer:  Normal   Signed and Held   Signed and Held  Gram stain  Once,   R    Question:  Patient immune status  Answer:  Normal   Signed and Held   Signed and Held  Strep pneumoniae urinary antigen  Once,   R     Signed and Held   Visual merchandiser and Held  CBC with Differential/Platelet  Daily,   R     Signed and Held   Visual merchandiser and Held  Comprehensive metabolic panel  Daily,   R     Signed and Held    Signed and Held  C-reactive protein  Daily,   R     Signed and Held   Signed and Held  CK  Daily,   R     Signed and Held   Signed and Held  Ferritin  Daily,   R     Signed and Held   Signed and Held  Interleukin-6, Plasma  Daily,   R     Signed and Held   Signed and Held  Magnesium  Daily,   R     Signed and Held   Signed and Held  Phosphorus  Daily,  R     Signed and Held   Signed and Held  Triglycerides  Daily,   R     Signed and Held   Signed and Held  Phosphorus  Tomorrow morning,   R     Signed and Held   Signed and Held  TSH  Once,   R    Comments: Cancel if already done within 1 month and notify MD    Signed and Held   Signed and Held  CBC  Once,   R    Comments: Call for hg <8.0    Signed and Held   Signed and Held  Prealbumin  Tomorrow morning,   R     Signed and Held          Vitals/Pain Today's Vitals   09/20/18 0230 09/20/18 0300 09/20/18 0330 09/20/18 0338  BP: 128/86 120/84 114/81   Pulse: 66 63 66   Resp: 20 18 (!) 21   Temp:      TempSrc:      SpO2: 94% 93% 96%   Weight:      Height:      PainSc:    Asleep    Isolation Precautions Droplet and Contact precautions  Medications Medications  0.9 %  sodium chloride infusion ( Intravenous New Bag/Given 09/19/18 2151)  sodium chloride (PF) 0.9 % injection (has no administration in time range)  heparin ADULT infusion 100 units/mL (25000 units/26850mL sodium chloride 0.45%) (1,600 Units/hr Intravenous New Bag/Given 09/20/18 0112)  cefTRIAXone (ROCEPHIN) 1 g in sodium chloride 0.9 % 100 mL IVPB (0 g Intravenous Stopped 09/19/18 2255)  azithromycin (ZITHROMAX) 500 mg in sodium chloride 0.9 % 250 mL IVPB (0 mg Intravenous Stopped 09/20/18 0015)  iohexol (OMNIPAQUE) 350 MG/ML injection 100 mL (80 mLs Intravenous Contrast Given 09/19/18 2301)  heparin bolus via infusion 5,000 Units (5,000 Units Intravenous Bolus from Bag 09/20/18 0114)    Mobility walks

## 2018-09-20 NOTE — Progress Notes (Signed)
ANTICOAGULATION CONSULT NOTE - Initial Consult  Pharmacy Consult for IV heparin Indication: pulmonary embolus  No Known Allergies  Patient Measurements: Height: 6\' 7"  (200.7 cm) Weight: 170 lb (77.1 kg) IBW/kg (Calculated) : 93.7 Heparin Dosing Weight: actual  Vital Signs: Temp: 97.8 F (36.6 C) (06/11 2034) Temp Source: Oral (06/11 2034) BP: 146/91 (06/11 2319) Pulse Rate: 77 (06/11 2319)  Labs: Recent Labs    09/19/18 2120  HGB 12.5*  HCT 39.1  PLT 334  CREATININE 1.00    Estimated Creatinine Clearance: 87.8 mL/min (by C-G formula based on SCr of 1 mg/dL).   Medical History: History reviewed. No pertinent past medical history.  Medications:  Scheduled:  . sodium chloride (PF)       Infusions:  . sodium chloride 10 mL/hr at 09/19/18 2151    Assessment: 86 yoM seen by MD today diagnosed with PNA and mulitple PEs in right lung.  Baseline labs: H/H=12.5/39.1, Plts = 334 INR=, aptt= pending  Goal of Therapy:  Heparin level 0.3-0.7 units/ml Monitor platelets by anticoagulation protocol: Yes   Plan:  Baseline INR and aptt STAT Heparin 5000 unit bolus x1 now Start drip at 1600 units/hr Daily CBC/HL Check 1st HL in 6 hours  Dorrene German 09/20/2018,12:16 AM

## 2018-09-20 NOTE — ED Notes (Signed)
Unable to draw blood cultures due to pt receiving IV abx- Rocephin and Azithromycin prior to blood culture order.

## 2018-09-20 NOTE — ED Notes (Signed)
[facility] called for transport 

## 2018-09-20 NOTE — ED Provider Notes (Signed)
Patient signed out to me by Dr. Eulis Foster to follow-up on CT scan.  Patient reports that he had a febrile illness that started 2 weeks ago.  The fever broke but he has had a persistent cough ever since.  He saw his primary care doctor today who ordered an outpatient CT because of pneumonia seen on x-ray.  He was called at home and told to come to the ER because they thought they saw a blood clot on the noncontrast CT.  CTA does confirm multiple PE in the right lung.  He has, however, bilateral infiltrates that are very suspicious for pattern seen in COVID-19.  COVID-19 testing still pending.  Based on these findings will require hospitalization.   Orpah Greek, MD 09/20/18 0001

## 2018-09-21 LAB — COMPREHENSIVE METABOLIC PANEL
ALT: 15 U/L (ref 0–44)
AST: 17 U/L (ref 15–41)
Albumin: 3.1 g/dL — ABNORMAL LOW (ref 3.5–5.0)
Alkaline Phosphatase: 48 U/L (ref 38–126)
Anion gap: 9 (ref 5–15)
BUN: 16 mg/dL (ref 6–20)
CO2: 28 mmol/L (ref 22–32)
Calcium: 9.1 mg/dL (ref 8.9–10.3)
Chloride: 102 mmol/L (ref 98–111)
Creatinine, Ser: 0.8 mg/dL (ref 0.61–1.24)
GFR calc Af Amer: >60 mL/min (ref >60)
GFR calc non Af Amer: >60 mL/min (ref >60)
Glucose, Bld: 215 mg/dL — ABNORMAL HIGH (ref 70–99)
Potassium: 4.9 mmol/L (ref 3.5–5.1)
Sodium: 139 mmol/L (ref 135–145)
Total Bilirubin: 0.4 mg/dL (ref 0.3–1.2)
Total Protein: 7.1 g/dL (ref 6.5–8.1)

## 2018-09-21 LAB — CBC WITH DIFFERENTIAL/PLATELET
Abs Immature Granulocytes: 0.07 10*3/uL (ref 0.00–0.07)
Basophils Absolute: 0 10*3/uL (ref 0.0–0.1)
Basophils Relative: 0 %
Eosinophils Absolute: 0 10*3/uL (ref 0.0–0.5)
Eosinophils Relative: 0 %
HCT: 40.4 % (ref 39.0–52.0)
Hemoglobin: 12.6 g/dL — ABNORMAL LOW (ref 13.0–17.0)
Immature Granulocytes: 1 %
Lymphocytes Relative: 9 %
Lymphs Abs: 0.6 10*3/uL — ABNORMAL LOW (ref 0.7–4.0)
MCH: 29.4 pg (ref 26.0–34.0)
MCHC: 31.2 g/dL (ref 30.0–36.0)
MCV: 94.4 fL (ref 80.0–100.0)
Monocytes Absolute: 0.1 10*3/uL (ref 0.1–1.0)
Monocytes Relative: 2 %
Neutro Abs: 6 10*3/uL (ref 1.7–7.7)
Neutrophils Relative %: 88 %
Platelets: 377 10*3/uL (ref 150–400)
RBC: 4.28 MIL/uL (ref 4.22–5.81)
RDW: 12.9 % (ref 11.5–15.5)
WBC: 6.9 10*3/uL (ref 4.0–10.5)
nRBC: 0 % (ref 0.0–0.2)

## 2018-09-21 LAB — FERRITIN: Ferritin: 734 ng/mL — ABNORMAL HIGH (ref 24–336)

## 2018-09-21 LAB — PROCALCITONIN: Procalcitonin: <0.1 ng/mL

## 2018-09-21 LAB — C-REACTIVE PROTEIN: CRP: 5.7 mg/dL — ABNORMAL HIGH (ref <1.0)

## 2018-09-21 MED ORDER — RIVAROXABAN (XARELTO) VTE STARTER PACK (15 & 20 MG): ORAL_TABLET | ORAL | 0 refills | Status: DC

## 2018-09-21 MED ORDER — PREDNISONE 10 MG PO TABS: ORAL_TABLET | ORAL | 0 refills | Status: DC

## 2018-09-21 MED ORDER — BENZONATATE 200 MG PO CAPS: 200.0000 mg | ORAL_CAPSULE | Freq: Three times a day (TID) | ORAL | 0 refills | Status: DC

## 2018-09-21 MED ORDER — ALBUTEROL SULFATE HFA 108 (90 BASE) MCG/ACT IN AERS: 2.0000 | INHALATION_SPRAY | RESPIRATORY_TRACT | 0 refills | Status: DC | PRN

## 2018-09-21 NOTE — Plan of Care (Signed)
  Problem: Education: Goal: Knowledge of General Education information will improve Description: Including pain rating scale, medication(s)/side effects and non-pharmacologic comfort measures 09/21/2018 1543 by Shanon Ace, RN Outcome: Adequate for Discharge 09/21/2018 0727 by Shanon Ace, RN Outcome: Progressing   Problem: Health Behavior/Discharge Planning: Goal: Ability to manage health-related needs will improve 09/21/2018 1543 by Shanon Ace, RN Outcome: Adequate for Discharge 09/21/2018 0727 by Shanon Ace, RN Outcome: Progressing   Problem: Clinical Measurements: Goal: Ability to maintain clinical measurements within normal limits will improve 09/21/2018 1543 by Shanon Ace, RN Outcome: Adequate for Discharge 09/21/2018 0727 by Shanon Ace, RN Outcome: Progressing Goal: Will remain free from infection 09/21/2018 1543 by Shanon Ace, RN Outcome: Adequate for Discharge 09/21/2018 0727 by Shanon Ace, RN Outcome: Progressing Goal: Diagnostic test results will improve 09/21/2018 1543 by Shanon Ace, RN Outcome: Adequate for Discharge 09/21/2018 0727 by Shanon Ace, RN Outcome: Progressing Goal: Respiratory complications will improve 09/21/2018 1543 by Shanon Ace, RN Outcome: Adequate for Discharge 09/21/2018 0727 by Shanon Ace, RN Outcome: Progressing Goal: Cardiovascular complication will be avoided 09/21/2018 1543 by Shanon Ace, RN Outcome: Adequate for Discharge 09/21/2018 0727 by Shanon Ace, RN Outcome: Progressing   Problem: Activity: Goal: Risk for activity intolerance will decrease 09/21/2018 1543 by Shanon Ace, RN Outcome: Adequate for Discharge 09/21/2018 0727 by Shanon Ace, RN Outcome: Progressing   Problem: Nutrition: Goal: Adequate nutrition will be maintained 09/21/2018 1543 by Shanon Ace, RN Outcome: Adequate for Discharge 09/21/2018 0727 by Shanon Ace, RN Outcome: Progressing   Problem: Coping: Goal: Level of anxiety  will decrease 09/21/2018 1543 by Shanon Ace, RN Outcome: Adequate for Discharge 09/21/2018 0727 by Shanon Ace, RN Outcome: Progressing   Problem: Elimination: Goal: Will not experience complications related to bowel motility 09/21/2018 1543 by Shanon Ace, RN Outcome: Adequate for Discharge 09/21/2018 0727 by Shanon Ace, RN Outcome: Progressing Goal: Will not experience complications related to urinary retention 09/21/2018 1543 by Shanon Ace, RN Outcome: Adequate for Discharge 09/21/2018 0727 by Shanon Ace, RN Outcome: Progressing   Problem: Pain Managment: Goal: General experience of comfort will improve 09/21/2018 1543 by Shanon Ace, RN Outcome: Adequate for Discharge 09/21/2018 0727 by Shanon Ace, RN Outcome: Progressing   Problem: Safety: Goal: Ability to remain free from injury will improve 09/21/2018 1543 by Shanon Ace, RN Outcome: Adequate for Discharge 09/21/2018 0727 by Shanon Ace, RN Outcome: Progressing   Problem: Skin Integrity: Goal: Risk for impaired skin integrity will decrease 09/21/2018 1543 by Shanon Ace, RN Outcome: Adequate for Discharge 09/21/2018 0727 by Shanon Ace, RN Outcome: Progressing

## 2018-09-21 NOTE — Plan of Care (Signed)

## 2018-09-21 NOTE — Plan of Care (Signed)
  Problem: Education: Goal: Knowledge of General Education information will improve Description: Including pain rating scale, medication(s)/side effects and non-pharmacologic comfort measures 09/21/2018 1543 by Treson Laura, RN Outcome: Adequate for Discharge 09/21/2018 0727 by Jessey Huyett, RN Outcome: Progressing   Problem: Health Behavior/Discharge Planning: Goal: Ability to manage health-related needs will improve 09/21/2018 1543 by Khanh Cordner, RN Outcome: Adequate for Discharge 09/21/2018 0727 by Nguyen Todorov, RN Outcome: Progressing   Problem: Clinical Measurements: Goal: Ability to maintain clinical measurements within normal limits will improve 09/21/2018 1543 by Anie Juniel, RN Outcome: Adequate for Discharge 09/21/2018 0727 by Sharif Rendell, RN Outcome: Progressing Goal: Will remain free from infection 09/21/2018 1543 by Corliss Lamartina, RN Outcome: Adequate for Discharge 09/21/2018 0727 by Jadriel Saxer, RN Outcome: Progressing Goal: Diagnostic test results will improve 09/21/2018 1543 by Nayely Dingus, RN Outcome: Adequate for Discharge 09/21/2018 0727 by Manjit Bufano, RN Outcome: Progressing Goal: Respiratory complications will improve 09/21/2018 1543 by Vann Okerlund, RN Outcome: Adequate for Discharge 09/21/2018 0727 by Keron Neenan, RN Outcome: Progressing Goal: Cardiovascular complication will be avoided 09/21/2018 1543 by Neko Mcgeehan, RN Outcome: Adequate for Discharge 09/21/2018 0727 by Pheonix Wisby, RN Outcome: Progressing   Problem: Activity: Goal: Risk for activity intolerance will decrease 09/21/2018 1543 by Hanz Winterhalter, RN Outcome: Adequate for Discharge 09/21/2018 0727 by Kwanza Cancelliere, RN Outcome: Progressing   Problem: Nutrition: Goal: Adequate nutrition will be maintained 09/21/2018 1543 by Brigham Cobbins, RN Outcome: Adequate for Discharge 09/21/2018 0727 by Klair Leising, RN Outcome: Progressing   Problem: Coping: Goal: Level of anxiety  will decrease 09/21/2018 1543 by Levora Werden, RN Outcome: Adequate for Discharge 09/21/2018 0727 by Gustava Berland, RN Outcome: Progressing   Problem: Elimination: Goal: Will not experience complications related to bowel motility 09/21/2018 1543 by Deniqua Perry, RN Outcome: Adequate for Discharge 09/21/2018 0727 by Hendrix Yurkovich, RN Outcome: Progressing Goal: Will not experience complications related to urinary retention 09/21/2018 1543 by Shaka Zech, RN Outcome: Adequate for Discharge 09/21/2018 0727 by Kaesen Rodriguez, RN Outcome: Progressing   Problem: Pain Managment: Goal: General experience of comfort will improve 09/21/2018 1543 by Omero Kowal, RN Outcome: Adequate for Discharge 09/21/2018 0727 by Litha Lamartina, RN Outcome: Progressing   Problem: Safety: Goal: Ability to remain free from injury will improve 09/21/2018 1543 by Marquesa Rath, RN Outcome: Adequate for Discharge 09/21/2018 0727 by Yarel Kilcrease, RN Outcome: Progressing   Problem: Skin Integrity: Goal: Risk for impaired skin integrity will decrease 09/21/2018 1543 by Ijanae Macapagal, RN Outcome: Adequate for Discharge 09/21/2018 0727 by Aseneth Hack, RN Outcome: Progressing   

## 2018-09-21 NOTE — TOC Transition Note (Addendum)
Transition of Care [facility]) - CM/SW Discharge Note   Patient Details  Name: Derek Norris MRN: [id number] Date of Birth: 10-10-1960  [facility] CM/SW Contact:  Ninfa Meeker, RN Phone Number: 09/21/2018, 9:14 AM   Clinical Narrative:  58 yr old gentleman was admitted and treated for Covid 19. Thankfully patient has improved and will discharge home. Has no HH needs. Case manager was asked to assist patient with Xarelto card. Spoke with patient to confirm his pharmacy. CM called the [facility], [phone], [phone] to the pharmacist at  [place] , [phone], [place]. Case manager also sent a picture of the card to patient's cell phone.  Case manager explained to patient that a followup appointment will arranged by CM on Monday and he will be notified of same.         Patient Goals and CMS Choice        Discharge Placement                       Discharge Plan and Services:  Home                                     Social Determinants of Health (SDOH) Interventions     Readmission Risk Interventions No flowsheet data found.

## 2018-09-21 NOTE — Progress Notes (Signed)
Discharge instructions provided to pt and Pt left with friend to home.

## 2018-09-21 NOTE — Discharge Summary (Signed)
PATIENT DETAILS Name: Niel Peretti Age: 58 y.o. Sex: male Date of Birth: 1960-10-26 MRN: [id number]. Admitting Physician: Therisa Doyne, MD VHQ:[id number], No Pcp Per  Admit Date: 09/19/2018 Discharge date: 09/21/2018  Recommendations for Outpatient Follow-up:  1. Follow up with PCP in 1-2 weeks 2. Please obtain BMP/CBC in one week 3. Needs hematology evaluation before anticoagulation discontinuation is considered.  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 4. Will need refills for Xarelto at next visit with PCP.  Admitted From:  Home  Disposition: Home   Home Health: No  Equipment/Devices: None  Discharge Condition: Stable  CODE STATUS: FULL CODE  Diet recommendation:  Regular  Brief Summary: See H&P, Labs, Consult and Test reports for all details in brief, patient is a58 y.o.malewith no significant past medical history-presented with almost 2-week history of cough, fever. Upon subsequent evaluation -he was found to have COVID-19 viral pneumonia and pulmonary embolism. He was subsequently admitted to the hospitalist service-see below for further details  Brief Hospital Course: Covid 19 Viral pneumonia:  Has some cough-but otherwise feels better-no shortness of breath-or hypoxia noted.  He did have some scattered rhonchi yesterday-but none today.  He was treated with supportive care-due to wheezing/rhonchi-he was started on a tapering course of steroids and as needed bronchodilators.  He has not required oxygen supplementation-his O2 saturation is in the high 90s-hence he did not qualify for Remdesivir.  Since he remained stable-inflammatory markers are downtrending-and still does not require any oxygen support-he is considered stable for discharge.    Pulmonary embolism: Appears to be unprovoked-no prior history of family history of VTE.  Fairly active-no travel history.  Not sure if this is related to COVID-needs further evaluation by hematology over the  next few months.  No signs of RV failure-no edema.  Lower extremity Dopplers did confirm bilateral lower extremity DVT.  Initially on IV heparin-transition to Xarelto yesterday-still doing well-and is stable for discharge with plans to continue Xarelto in the outpatient setting.   Patient aware that he will require uninterrupted anticoagulation for at least 6 to 9 months (if not indefinitely), he will also require hematology evaluation before discontinuing anticoagulation as this is a unprovoked DVT/PE.  Procedures/Studies: None  Discharge Diagnoses:  Active Problems:   CAP (community acquired pneumonia)   Pulmonary embolism (HCC)   Suspected Covid-19 Virus Infection   Pulmonary embolus (HCC)   Pneumonia due to COVID-19 virus   Discharge Instructions:  Activity:  As tolerated  Discharge Instructions    Call MD for:  difficulty breathing, headache or visual disturbances   Complete by: As directed    Call MD for:  persistant nausea and vomiting   Complete by: As directed    Call MD for:  temperature >100.4   Complete by: As directed    Diet general   Complete by: As directed    Discharge instructions   Complete by: As directed    Follow with Primary MD in 1 week  Please ask your primary care MD to refer you to a hematologist in the next few months-before you come off anticoagulation  You need at least 6 to 9 months of uninterrupted anticoagulation with Xarelto.  Please call your primary care practitioner in 1 week to get further refills.  Please get a complete blood count and chemistry panel checked by your Primary MD at your next visit, and again as instructed by your Primary MD.  Get Medicines reviewed and adjusted: Please take all your medications with you for your next  visit with your Primary MD  Laboratory/radiological data: Please request your Primary MD to go over all hospital tests and procedure/radiological results at the follow up, please ask your Primary MD to get  all Hospital records sent to his/her office.  In some cases, they will be blood work, cultures and biopsy results pending at the time of your discharge. Please request that your primary care M.D. follows up on these results.  Also Note the following: If you experience worsening of your admission symptoms, develop shortness of breath, life threatening emergency, suicidal or homicidal thoughts you must seek medical attention immediately by calling 911 or calling your MD immediately  if symptoms less severe.  You must read complete instructions/literature along with all the possible adverse reactions/side effects for all the Medicines you take and that have been prescribed to you. Take any new Medicines after you have completely understood and accpet all the possible adverse reactions/side effects.   Do not drive when taking Pain medications or sleeping medications (Benzodaizepines)  Do not take more than prescribed Pain, Sleep and Anxiety Medications. It is not advisable to combine anxiety,sleep and pain medications without talking with your primary care practitioner  Special Instructions: If you have smoked or chewed Tobacco  in the last 2 yrs please stop smoking, stop any regular Alcohol  and or any Recreational drug use.  Wear Seat belts while driving.  Please note: You were cared for by a hospitalist during your hospital stay. Once you are discharged, your primary care physician will handle any further medical issues. Please note that NO REFILLS for any discharge medications will be authorized once you are discharged, as it is imperative that you return to your primary care physician (or establish a relationship with a primary care physician if you do not have one) for your post hospital discharge needs so that they can reassess your need for medications and monitor your lab values.   ?   Person Under Monitoring Name: Derek Norris  Location: [address] [place]  [address]   Infection Prevention Recommendations for Individuals Confirmed to have, or Being Evaluated for, 2019 Novel Coronavirus (COVID-19) Infection Who Receive Care at Home  Individuals who are confirmed to have, or are being evaluated for, COVID-19 should follow the prevention steps below until a healthcare provider or local or state health department says they can return to normal activities.  Stay home except to get medical care You should restrict activities outside your home, except for getting medical care. Do not go to work, school, or public areas, and do not use public transportation or taxis.  Call ahead before visiting your doctor Before your medical appointment, call the healthcare provider and tell them that you have, or are being evaluated for, COVID-19 infection. This will help the healthcare providers office take steps to keep other people from getting infected. Ask your healthcare provider to call the local or state health department.  Monitor your symptoms Seek prompt medical attention if your illness is worsening (e.g., difficulty breathing). Before going to your medical appointment, call the healthcare provider and tell them that you have, or are being evaluated for, COVID-19 infection. Ask your healthcare provider to call the local or state health department.  Wear a facemask You should wear a facemask that covers your nose and mouth when you are in the same room with other people and when you visit a healthcare provider. People who live with or visit you should also wear a facemask while they are  in the same room with you.  Separate yourself from other people in your home As much as possible, you should stay in a different room from other people in your home. Also, you should use a separate bathroom, if available.  Avoid sharing household items You should not share dishes, drinking glasses, cups, eating utensils, towels, bedding, or other items with other  people in your home. After using these items, you should wash them thoroughly with soap and water.  Cover your coughs and sneezes Cover your mouth and nose with a tissue when you cough or sneeze, or you can cough or sneeze into your sleeve. Throw used tissues in a lined trash can, and immediately wash your hands with soap and water for at least 20 seconds or use an alcohol-based hand rub.  Wash your [employer] Corporationhands Wash your hands often and thoroughly with soap and water for at least 20 seconds. You can use an alcohol-based hand sanitizer if soap and water are not available and if your hands are not visibly dirty. Avoid touching your eyes, nose, and mouth with unwashed hands.   Prevention Steps for Caregivers and Household Members of Individuals Confirmed to have, or Being Evaluated for, COVID-19 Infection Being Cared for in the Home  If you live with, or provide care at home for, a person confirmed to have, or being evaluated for, COVID-19 infection please follow these guidelines to prevent infection:  Follow healthcare providers instructions Make sure that you understand and can help the patient follow any healthcare provider instructions for all care.  Provide for the patients basic needs You should help the patient with basic needs in the home and provide support for getting groceries, prescriptions, and other personal needs.  Monitor the patients symptoms If they are getting sicker, call his or her medical provider and tell them that the patient has, or is being evaluated for, COVID-19 infection. This will help the healthcare providers office take steps to keep other people from getting infected. Ask the healthcare provider to call the local or state health department.  Limit the number of people who have contact with the patient If possible, have only one caregiver for the patient. Other household members should stay in another home or place of residence. If this is not possible,  they should stay in another room, or be separated from the patient as much as possible. Use a separate bathroom, if available. Restrict visitors who do not have an essential need to be in the home.  Keep older adults, very young children, and other sick people away from the patient Keep older adults, very young children, and those who have compromised immune systems or chronic health conditions away from the patient. This includes people with chronic heart, lung, or kidney conditions, diabetes, and cancer.  Ensure good ventilation Make sure that shared spaces in the home have good air flow, such as from an air conditioner or an opened window, weather permitting.  Wash your hands often Wash your hands often and thoroughly with soap and water for at least 20 seconds. You can use an alcohol based hand sanitizer if soap and water are not available and if your hands are not visibly dirty. Avoid touching your eyes, nose, and mouth with unwashed hands. Use disposable paper towels to dry your hands. If not available, use dedicated cloth towels and replace them when they become wet.  Wear a facemask and gloves Wear a disposable facemask at all times in the room and gloves when you  touch or have contact with the patients blood, body fluids, and/or secretions or excretions, such as sweat, saliva, sputum, nasal mucus, vomit, urine, or feces.  Ensure the mask fits over your nose and mouth tightly, and do not touch it during use. Throw out disposable facemasks and gloves after using them. Do not reuse. Wash your hands immediately after removing your facemask and gloves. If your personal clothing becomes contaminated, carefully remove clothing and launder. Wash your hands after handling contaminated clothing. Place all used disposable facemasks, gloves, and other waste in a lined container before disposing them with other household waste. Remove gloves and wash your hands immediately after handling these  items.  Do not share dishes, glasses, or other household items with the patient Avoid sharing household items. You should not share dishes, drinking glasses, cups, eating utensils, towels, bedding, or other items with a patient who is confirmed to have, or being evaluated for, COVID-19 infection. After the person uses these items, you should wash them thoroughly with soap and water.  Wash laundry thoroughly Immediately remove and wash clothes or bedding that have blood, body fluids, and/or secretions or excretions, such as sweat, saliva, sputum, nasal mucus, vomit, urine, or feces, on them. Wear gloves when handling laundry from the patient. Read and follow directions on labels of laundry or clothing items and detergent. In general, wash and dry with the warmest temperatures recommended on the label.  Clean all areas the individual has used often Clean all touchable surfaces, such as counters, tabletops, doorknobs, bathroom fixtures, toilets, phones, keyboards, tablets, and bedside tables, every day. Also, clean any surfaces that may have blood, body fluids, and/or secretions or excretions on them. Wear gloves when cleaning surfaces the patient has come in contact with. Use a diluted bleach solution (e.g., dilute bleach with 1 part bleach and 10 parts water) or a household disinfectant with a label that says EPA-registered for coronaviruses. To make a bleach solution at home, add 1 tablespoon of bleach to 1 quart (4 cups) of water. For a larger supply, add  cup of bleach to 1 gallon (16 cups) of water. Read labels of cleaning products and follow recommendations provided on product labels. Labels contain instructions for safe and effective use of the cleaning product including precautions you should take when applying the product, such as wearing gloves or eye protection and making sure you have good ventilation during use of the product. Remove gloves and wash hands immediately after  cleaning.  Monitor yourself for signs and symptoms of illness Caregivers and household members are considered close contacts, should monitor their health, and will be asked to limit movement outside of the home to the extent possible. Follow the monitoring steps for close contacts listed on the symptom monitoring form.   ? If you have additional questions, contact your local health department or call the epidemiologist on call at [phone] (available 24/7). ? This guidance is subject to change. For the most up-to-date guidance from [facility], please refer to their website: [employer]Met[employer].hu   Increase activity slowly   Complete by: As directed      Allergies as of 09/21/2018   No Known Allergies     Medication List    STOP taking these medications   acetaminophen 325 MG tablet Commonly known as: TYLENOL   ALKA-SELTZER PLS SINUS & COUGH PO   aspirin 325 MG EC tablet   DM-APAP-CPM 30-650-4 MG/30ML Liqd   multivitamin-iron-minerals-folic acid chewable tablet   promethazine-codeine 6.25-10 MG/5ML syrup Commonly known as:  PHENERGAN with CODEINE   vitamin C 1000 MG tablet     TAKE these medications   albuterol 108 (90 Base) MCG/ACT inhaler Commonly known as: VENTOLIN HFA Inhale 2 puffs into the lungs every 4 (four) hours as needed for wheezing or shortness of breath.   benzonatate 200 MG capsule Commonly known as: TESSALON Take 1 capsule (200 mg total) by mouth 3 (three) times daily.   predniSONE 10 MG tablet Commonly known as: DELTASONE Take 40 mg daily for 1 day, 30 mg daily for 1 day, 20 mg daily for 1 days,10 mg daily for 1 day, then stop   Rivaroxaban 15 & 20 MG Tbpk Take as directed on package: Start with one 15mg  tablet by mouth twice a day with food. On Day 22 (July 3rd), switch to one 20mg  tablet once a day with food.      Follow-up [facility] Follow up.    Why: You have a scheduled telephone follow up visit on June 19th at 8:50 am with Jodene Nam, NP. Please be available and at your phone at this time.   Contact information: [place] [address] [phone]         No Known Allergies  Consultations:   None  Other Procedures/Studies: Ct Chest W Contrast  Addendum Date: 09/19/2018   ADDENDUM REPORT: 09/19/2018 16:09 ADDENDUM: Critical Value/emergent results were called by telephone at the time of interpretation on 09/19/2018 at 4:09 pm to Dr. De Nurse , who verbally acknowledged these results. Electronically Signed   By: Marin Olp M.D.   On: 09/19/2018 16:09   Result Date: 09/19/2018 CLINICAL DATA:  Abnormal chest x-ray at physician's office. Two weeks of cough which shortness-of-breath and reports mold exposure at home. EXAM: CT CHEST WITH CONTRAST TECHNIQUE: Multidetector CT imaging of the chest was performed during intravenous contrast administration. CONTRAST:  78mL ISOVUE-370 IOPAMIDOL (ISOVUE-370) INJECTION 76% COMPARISON:  Chest x-ray 09/16/2018 FINDINGS: Cardiovascular: Heart is normal size. Thoracic aorta is within normal. Although this study is not optimized for contrast opacification of the pulmonary arteries, there are findings suggesting right-sided pulmonary emboli. Remaining vascular structures are unremarkable. Mediastinum/Nodes: No mediastinal or hilar adenopathy. Remaining mediastinal structures are unremarkable. Subcentimeter left thyroid hypodense nodule. Lungs/Pleura: Lungs are adequately inflated and demonstrate bilateral patchy peripheral airspace opacification worse over the mid to lower lungs. No evidence of effusion. Airways are normal. Upper Abdomen: No acute findings. Musculoskeletal: No acute findings. IMPRESSION: 1. Patchy bilateral peripheral airspace process over the mid to lower lungs. Findings likely due to multifocal pneumonia which may be of bacterial or possible  viral origin. Recommend follow-up to resolution. 2. Although this exam is not optimized for pulmonary artery opacification, there are findings suggesting several right-sided pulmonary emboli. Recommend CTA of the chest for further evaluation. Electronically Signed: By: Marin Olp M.D. On: 09/19/2018 15:48   Ct Angio Chest Pe W/cm &/or Wo Cm  Addendum Date: 09/19/2018   ADDENDUM REPORT: 09/19/2018 23:32 ADDENDUM: Critical Value/emergent results were called by telephone at the time of interpretation on 09/19/2018 at 2328 hours to Dr. Scot Jun in the ED who verbally acknowledged these results. Electronically Signed   By: Genevie Ann M.D.   On: 09/19/2018 23:32   Result Date: 09/19/2018 CLINICAL DATA:  58 year old male with abnormal chest CT earlier today. Shortness of breath. Pending COVID-19 testing. EXAM: CT ANGIOGRAPHY CHEST WITH CONTRAST TECHNIQUE: Multidetector CT imaging of the chest was performed using  the standard protocol during bolus administration of intravenous contrast. Multiplanar CT image reconstructions and MIPs were obtained to evaluate the vascular anatomy. CONTRAST:  80mL OMNIPAQUE IOHEXOL 350 MG/ML SOLN COMPARISON:  Chest CT 1518 hours. FINDINGS: Cardiovascular: Good contrast bolus timing in the pulmonary arterial tree. No pulmonary artery filling defect identified in the left lung, but there is pulmonary artery clot at the branching of the right main pulmonary artery extending into the right upper lobe (series 5, image 141, right middle lobe (image 167), and right lower lobe (image 180). Much of the visible thrombus is non occlusive. There is some occlusive thrombus suspected in the posterior basal segment of the right lower lobe. There is some occlusive thrombus visible in the subsegmental branches to the medial segment of the right middle lobe. No central or saddle embolus. Stable cardiac size at the upper limits of normal. No pericardial effusion. Negative visible aorta.  Mediastinum/Nodes: Negative, no lymphadenopathy. Lungs/Pleura: Major airways remain patent. Confluent but indistinct bilateral peripheral lower lobe, left upper lobe and lingula opacity redemonstrated and this does not directly correspond to areas of pulmonary emboli. No pleural effusion. Upper Abdomen: Negative visible liver, spleen, pancreas, adrenal glands, kidneys, and bowel in the upper abdomen. Musculoskeletal: No acute osseous abnormality identified. Review of the MIP images confirms the above findings. IMPRESSION: 1. Confirmed acute pulmonary emboli with lobar involvement throughout the right lung. However, much of the visible thrombus is nonocclusive, and does not appear to directly correspond to the abnormal pulmonary opacity. 2. Continued superimposed bilateral pulmonary opacity in a pattern suspicious for COVID-19 pneumonia. No pleural effusion. Electronically Signed: By: Odessa Fleming M.D. On: 09/19/2018 23:25   Vas [place] Lower Extremity Venous (dvt)  Result Date: 09/20/2018  Lower Venous Study Indications: Pulmonary embolism.  Comparison Study: No prior study. Performing Technologist: Chanda Busing RVT  Examination Guidelines: A complete evaluation includes B-mode imaging, spectral Doppler, color Doppler, and power Doppler as needed of all accessible portions of each vessel. Bilateral testing is considered an integral part of a complete examination. Limited examinations for reoccurring indications may be performed as noted.  +---------+---------------+---------+-----------+----------+-------+  RIGHT     Compressibility Phasicity Spontaneity Properties Summary  +---------+---------------+---------+-----------+----------+-------+  CFV       Full            Yes       Yes                             +---------+---------------+---------+-----------+----------+-------+  SFJ       Full                                                      +---------+---------------+---------+-----------+----------+-------+  FV  Prox   Full                                                      +---------+---------------+---------+-----------+----------+-------+  FV Mid    Full                                                      +---------+---------------+---------+-----------+----------+-------+  FV Distal Full                                                      +---------+---------------+---------+-----------+----------+-------+  PFV       Full                                                      +---------+---------------+---------+-----------+----------+-------+  POP       Partial         Yes       Yes                    Acute    +---------+---------------+---------+-----------+----------+-------+  PTV       None                                             Acute    +---------+---------------+---------+-----------+----------+-------+  PERO      None                                             Acute    +---------+---------------+---------+-----------+----------+-------+   +---------+---------------+---------+-----------+----------+-------+  LEFT      Compressibility Phasicity Spontaneity Properties Summary  +---------+---------------+---------+-----------+----------+-------+  CFV       Full            Yes       Yes                             +---------+---------------+---------+-----------+----------+-------+  SFJ       Full                                                      +---------+---------------+---------+-----------+----------+-------+  FV Prox   Full                                                      +---------+---------------+---------+-----------+----------+-------+  FV Mid    Full                                                      +---------+---------------+---------+-----------+----------+-------+  FV Distal Full                                                      +---------+---------------+---------+-----------+----------+-------+  PFV       Full                                                       +---------+---------------+---------+-----------+----------+-------+  POP       Full            Yes       Yes                             +---------+---------------+---------+-----------+----------+-------+  PTV       Partial                                          Acute    +---------+---------------+---------+-----------+----------+-------+  PERO      None                                             Acute    +---------+---------------+---------+-----------+----------+-------+     Summary: Right: Findings consistent with acute deep vein thrombosis involving the right popliteal vein, right posterior tibial vein, and right peroneal vein. No cystic structure found in the popliteal fossa. Left: Findings consistent with acute deep vein thrombosis involving the left posterior tibial vein, and left peroneal vein. No cystic structure found in the popliteal fossa.  *See table(s) above for measurements and observations.    Preliminary       TODAY-DAY OF DISCHARGE:  Subjective:   Jamerson Tormey today has no headache,no chest abdominal pain,no new weakness tingling or numbness, feels much better wants to go home today.   Objective:   Blood pressure 112/71, pulse 62, temperature 98.1 F (36.7 C), temperature source Oral, resp. rate 15, height 6\' 7"  (2.007 m), weight 77.1 kg, SpO2 100 %.  Intake/Output Summary (Last 24 hours) at 09/21/2018 0923 Last data filed at 09/21/2018 0835 Gross per 24 hour  Intake 921.25 ml  Output --  Net 921.25 ml   Filed Weights   09/19/18 2048  Weight: 77.1 kg    Exam: Awake Alert, Oriented *3, No new F.N deficits, Normal affect [place].AT,PERRAL Supple Neck,No JVD, No cervical lymphadenopathy appriciated.  Symmetrical Chest wall movement, Good air movement bilaterally, CTAB RRR,No Gallops,Rubs or new Murmurs, No Parasternal Heave +ve B.Sounds, Abd Soft, Non tender, No organomegaly appriciated, No rebound -guarding or rigidity. No Cyanosis, Clubbing or edema, No new Rash or  bruise   PERTINENT RADIOLOGIC STUDIES: Ct Chest W Contrast  Addendum Date: 09/19/2018   ADDENDUM REPORT: 09/19/2018 16:09 ADDENDUM: Critical Value/emergent results were called by telephone at the time of interpretation on 09/19/2018 at 4:09 pm to Dr. Charlesetta Shanks , who verbally acknowledged these results. Electronically Signed   By: Elberta Fortis M.D.   On: 09/19/2018 16:09   Result Date: 09/19/2018 CLINICAL DATA:  Abnormal chest x-ray at physician's office. Two weeks of cough which shortness-of-breath and reports mold exposure at home. EXAM: CT CHEST WITH CONTRAST TECHNIQUE: Multidetector CT imaging of the chest was performed during intravenous contrast administration. CONTRAST:  75mL ISOVUE-370 IOPAMIDOL (ISOVUE-370) INJECTION 76% COMPARISON:  Chest x-ray 09/16/2018 FINDINGS: Cardiovascular: Heart is  normal size. Thoracic aorta is within normal. Although this study is not optimized for contrast opacification of the pulmonary arteries, there are findings suggesting right-sided pulmonary emboli. Remaining vascular structures are unremarkable. Mediastinum/Nodes: No mediastinal or hilar adenopathy. Remaining mediastinal structures are unremarkable. Subcentimeter left thyroid hypodense nodule. Lungs/Pleura: Lungs are adequately inflated and demonstrate bilateral patchy peripheral airspace opacification worse over the mid to lower lungs. No evidence of effusion. Airways are normal. Upper Abdomen: No acute findings. Musculoskeletal: No acute findings. IMPRESSION: 1. Patchy bilateral peripheral airspace process over the mid to lower lungs. Findings likely due to multifocal pneumonia which may be of bacterial or possible viral origin. Recommend follow-up to resolution. 2. Although this exam is not optimized for pulmonary artery opacification, there are findings suggesting several right-sided pulmonary emboli. Recommend CTA of the chest for further evaluation. Electronically Signed: By: Elberta Fortis M.D. On:  09/19/2018 15:48   Ct Angio Chest Pe W/cm &/or Wo Cm  Addendum Date: 09/19/2018   ADDENDUM REPORT: 09/19/2018 23:32 ADDENDUM: Critical Value/emergent results were called by telephone at the time of interpretation on 09/19/2018 at 2328 hours to Dr. Tommas Olp in the ED who verbally acknowledged these results. Electronically Signed   By: Odessa Fleming M.D.   On: 09/19/2018 23:32   Result Date: 09/19/2018 CLINICAL DATA:  58 year old male with abnormal chest CT earlier today. Shortness of breath. Pending COVID-19 testing. EXAM: CT ANGIOGRAPHY CHEST WITH CONTRAST TECHNIQUE: Multidetector CT imaging of the chest was performed using the standard protocol during bolus administration of intravenous contrast. Multiplanar CT image reconstructions and MIPs were obtained to evaluate the vascular anatomy. CONTRAST:  80mL OMNIPAQUE IOHEXOL 350 MG/ML SOLN COMPARISON:  Chest CT 1518 hours. FINDINGS: Cardiovascular: Good contrast bolus timing in the pulmonary arterial tree. No pulmonary artery filling defect identified in the left lung, but there is pulmonary artery clot at the branching of the right main pulmonary artery extending into the right upper lobe (series 5, image 141, right middle lobe (image 167), and right lower lobe (image 180). Much of the visible thrombus is non occlusive. There is some occlusive thrombus suspected in the posterior basal segment of the right lower lobe. There is some occlusive thrombus visible in the subsegmental branches to the medial segment of the right middle lobe. No central or saddle embolus. Stable cardiac size at the upper limits of normal. No pericardial effusion. Negative visible aorta. Mediastinum/Nodes: Negative, no lymphadenopathy. Lungs/Pleura: Major airways remain patent. Confluent but indistinct bilateral peripheral lower lobe, left upper lobe and lingula opacity redemonstrated and this does not directly correspond to areas of pulmonary emboli. No pleural effusion. Upper Abdomen:  Negative visible liver, spleen, pancreas, adrenal glands, kidneys, and bowel in the upper abdomen. Musculoskeletal: No acute osseous abnormality identified. Review of the MIP images confirms the above findings. IMPRESSION: 1. Confirmed acute pulmonary emboli with lobar involvement throughout the right lung. However, much of the visible thrombus is nonocclusive, and does not appear to directly correspond to the abnormal pulmonary opacity. 2. Continued superimposed bilateral pulmonary opacity in a pattern suspicious for COVID-19 pneumonia. No pleural effusion. Electronically Signed: By: Odessa Fleming M.D. On: 09/19/2018 23:25   Vas [place] Lower Extremity Venous (dvt)  Result Date: 09/20/2018  Lower Venous Study Indications: Pulmonary embolism.  Comparison Study: No prior study. Performing Technologist: Chanda Busing RVT  Examination Guidelines: A complete evaluation includes B-mode imaging, spectral Doppler, color Doppler, and power Doppler as needed of all accessible portions of each vessel. Bilateral testing is considered an integral  part of a complete examination. Limited examinations for reoccurring indications may be performed as noted.  +---------+---------------+---------+-----------+----------+-------+  RIGHT     Compressibility Phasicity Spontaneity Properties Summary  +---------+---------------+---------+-----------+----------+-------+  CFV       Full            Yes       Yes                             +---------+---------------+---------+-----------+----------+-------+  SFJ       Full                                                      +---------+---------------+---------+-----------+----------+-------+  FV Prox   Full                                                      +---------+---------------+---------+-----------+----------+-------+  FV Mid    Full                                                      +---------+---------------+---------+-----------+----------+-------+  FV Distal Full                                                       +---------+---------------+---------+-----------+----------+-------+  PFV       Full                                                      +---------+---------------+---------+-----------+----------+-------+  POP       Partial         Yes       Yes                    Acute    +---------+---------------+---------+-----------+----------+-------+  PTV       None                                             Acute    +---------+---------------+---------+-----------+----------+-------+  PERO      None                                             Acute    +---------+---------------+---------+-----------+----------+-------+   +---------+---------------+---------+-----------+----------+-------+  LEFT      Compressibility Phasicity Spontaneity Properties Summary  +---------+---------------+---------+-----------+----------+-------+  CFV       Full            Yes  Yes                             +---------+---------------+---------+-----------+----------+-------+  SFJ       Full                                                      +---------+---------------+---------+-----------+----------+-------+  FV Prox   Full                                                      +---------+---------------+---------+-----------+----------+-------+  FV Mid    Full                                                      +---------+---------------+---------+-----------+----------+-------+  FV Distal Full                                                      +---------+---------------+---------+-----------+----------+-------+  PFV       Full                                                      +---------+---------------+---------+-----------+----------+-------+  POP       Full            Yes       Yes                             +---------+---------------+---------+-----------+----------+-------+  PTV       Partial                                          Acute     +---------+---------------+---------+-----------+----------+-------+  PERO      None                                             Acute    +---------+---------------+---------+-----------+----------+-------+     Summary: Right: Findings consistent with acute deep vein thrombosis involving the right popliteal vein, right posterior tibial vein, and right peroneal vein. No cystic structure found in the popliteal fossa. Left: Findings consistent with acute deep vein thrombosis involving the left posterior tibial vein, and left peroneal vein. No cystic structure found in the popliteal fossa.  *See table(s) above for measurements and observations.    Preliminary      PERTINENT LAB RESULTS: CBC: Recent Labs    09/20/18 0630 09/21/18 0400  WBC 6.5 6.9  HGB 11.5* 12.6*  HCT 37.1* 40.4  PLT 353 377   CMET CMP     Component Value Date/Time   NA 139 09/21/2018 0400   K 4.9 09/21/2018 0400   CL 102 09/21/2018 0400   CO2 28 09/21/2018 0400   GLUCOSE 215 (H) 09/21/2018 0400   BUN 16 09/21/2018 0400   CREATININE 0.80 09/21/2018 0400   CALCIUM 9.1 09/21/2018 0400   PROT 7.1 09/21/2018 0400   ALBUMIN 3.1 (L) 09/21/2018 0400   AST 17 09/21/2018 0400   ALT 15 09/21/2018 0400   ALKPHOS 48 09/21/2018 0400   BILITOT 0.4 09/21/2018 0400   GFRNONAA >60 09/21/2018 0400   GFRAA >60 09/21/2018 0400    GFR Estimated Creatinine Clearance: 109.8 mL/min (by C-G formula based on SCr of 0.8 mg/dL). No results for input(s): LIPASE, AMYLASE in the last 72 hours. Recent Labs    09/20/18 0012  CKTOTAL 78  TROPONINI <0.03   Invalid input(s): POCBNP Recent Labs    09/20/18 0012  DDIMER >20.00*   No results for input(s): HGBA1C in the last 72 hours. Recent Labs    09/20/18 0012  TRIG 142   Recent Labs    09/20/18 0630  TSH 0.788   Recent Labs    09/20/18 0012 09/21/18 0400  FERRITIN 650* 734*   Coags: Recent Labs    09/20/18 0012  INR 1.1   Microbiology: Recent Results (from the  past 240 hour(s))  SARS Coronavirus 2 (CEPHEID - Performed [facility]), Hosp Order     Status: Abnormal   Collection Time: 09/19/18 11:25 PM   Specimen: Nasopharyngeal Swab  Result Value Ref Range Status   SARS Coronavirus 2 POSITIVE (A) NEGATIVE Final    Comment: CRITICAL RESULT CALLED TO, READ BACK BY AND VERIFIED WITH: RN Shela CommonsJ [facility] AT 0209 09/20/18 CRUICKSHANK A (NOTE) If result is NEGATIVE SARS-CoV-2 target nucleic acids are NOT DETECTED. The SARS-CoV-2 RNA is generally detectable in upper and lower  respiratory specimens during the acute phase of infection. The lowest  concentration of SARS-CoV-2 viral copies this assay can detect is 250  copies / mL. A negative result does not preclude SARS-CoV-2 infection  and should not be used as the sole basis for treatment or other  patient management decisions.  A negative result may occur with  improper specimen collection / handling, submission of specimen other  than nasopharyngeal swab, presence of viral mutation(s) within the  areas targeted by this assay, and inadequate number of viral copies  (<250 copies / mL). A negative result must be combined with clinical  observations, patient history, and epidemiological information. If result is POSITIVE SARS-CoV-2 target nucleic acids  are DETECTED. The SARS-CoV-2 RNA is generally detectable in upper and lower  respiratory specimens during the acute phase of infection.  Positive  results are indicative of active infection with SARS-CoV-2.  Clinical  correlation with patient history and other diagnostic information is  necessary to determine patient infection status.  Positive results do  not rule out bacterial infection or co-infection with other viruses. If result is PRESUMPTIVE POSTIVE SARS-CoV-2 nucleic acids MAY BE PRESENT.   A presumptive positive result was obtained on the submitted specimen  and confirmed on repeat testing.  While 2019 novel coronavirus    (SARS-CoV-2) nucleic acids may be present in the submitted sample  additional confirmatory testing may be necessary for epidemiological  and / or clinical management purposes  to differentiate between  SARS-CoV-2 and other Sarbecovirus  currently known to infect humans.  If clinically indicated additional testing with an alternate test  [facility] [phone]) is advised. The SARS-CoV-2 RNA is generally  detectable in upper and lower respiratory specimens during the acute  phase of infection. The expected result is Negative. Fact Sheet for Patients:  BoilerBrush.com.cy Fact Sheet for Healthcare Providers: https://pope.com/ This test is not yet approved or cleared by the [place] FDA and has been authorized for detection and/or diagnosis of SARS-CoV-2 by FDA under an Emergency Use Authorization (EUA).  This EUA will remain in effect (meaning this test can be used) for the duration of the COVID-19 declaration under Section 564(b)(1) of the Act, 21 U.S.C. section 360bbb-3(b)(1), unless the authorization is terminated or revoked sooner. Performed at [facility], [address], [place], [place] [address]     FURTHER DISCHARGE INSTRUCTIONS:  Get Medicines reviewed and adjusted: Please take all your medications with you for your next visit with your Primary MD  Laboratory/radiological data: Please request your Primary MD to go over all hospital tests and procedure/radiological results at the follow up, please ask your Primary MD to get all Hospital records sent to his/her office.  In some cases, they will be blood work, cultures and biopsy results pending at the time of your discharge. Please request that your primary care M.D. goes through all the records of your hospital data and follows up on these results.  Also Note the following: If you experience worsening of your admission symptoms, develop shortness of  breath, life threatening emergency, suicidal or homicidal thoughts you must seek medical attention immediately by calling 911 or calling your MD immediately  if symptoms less severe.  You must read complete instructions/literature along with all the possible adverse reactions/side effects for all the Medicines you take and that have been prescribed to you. Take any new Medicines after you have completely understood and accpet all the possible adverse reactions/side effects.   Do not drive when taking Pain medications or sleeping medications (Benzodaizepines)  Do not take more than prescribed Pain, Sleep and Anxiety Medications. It is not advisable to combine anxiety,sleep and pain medications without talking with your primary care practitioner  Special Instructions: If you have smoked or chewed Tobacco  in the last 2 yrs please stop smoking, stop any regular Alcohol  and or any Recreational drug use.  Wear Seat belts while driving.  Please note: You were cared for by a hospitalist during your hospital stay. Once you are discharged, your primary care physician will handle any further medical issues. Please note that NO REFILLS for any discharge medications will be authorized once you are discharged, as it is imperative that you return to your primary care physician (or establish a relationship with a primary care physician if you do not have one) for your post hospital discharge needs so that they can reassess your need for medications and monitor your lab values.  Total Time spent coordinating discharge including counseling, education and face to face time equals 35 minutes.  SignedJeoffrey Massed 09/21/2018 9:23 AM

## 2018-09-27 ENCOUNTER — Inpatient Hospital Stay: Payer: BC Managed Care – PPO | Admitting: Primary Care

## 2018-10-02 ENCOUNTER — Encounter: Payer: Self-pay | Admitting: Nurse Practitioner

## 2018-10-02 ENCOUNTER — Other Ambulatory Visit: Payer: Self-pay

## 2018-10-02 ENCOUNTER — Ambulatory Visit: Payer: BC Managed Care – PPO | Attending: Nurse Practitioner | Admitting: Nurse Practitioner

## 2018-10-02 DIAGNOSIS — U071 COVID-19: Secondary | ICD-10-CM

## 2018-10-02 DIAGNOSIS — I2699 Other pulmonary embolism without acute cor pulmonale: Secondary | ICD-10-CM

## 2018-10-02 DIAGNOSIS — J189 Pneumonia, unspecified organism: Secondary | ICD-10-CM

## 2018-10-02 DIAGNOSIS — Z09 Encounter for follow-up examination after completed treatment for conditions other than malignant neoplasm: Secondary | ICD-10-CM | POA: Diagnosis not present

## 2018-10-02 NOTE — Progress Notes (Signed)
Virtual Visit via Telephone Note Due to national recommendations of social distancing due to COVID 19, telehealth visit is felt to be most appropriate for this patient at this time.  I discussed the limitations, risks, security and privacy concerns of performing an evaluation and management service by telephone and the availability of in person appointments. I also discussed with the patient that there may be a patient responsible charge related to this service. The patient expressed understanding and agreed to proceed.    I connected with Derek Norris on 10/02/18  at   1:50 PM EDT  EDT by telephone and verified that I am speaking with the correct person using two identifiers.   Consent I discussed the limitations, risks, security and privacy concerns of performing an evaluation and management service by telephone and the availability of in person appointments. I also discussed with the patient that there may be a patient responsible charge related to this service. The patient expressed understanding and agreed to proceed.   Location of Patient: Private Residence   [facility]    Persons participating in Telemedicine visit: Bertram DenverZelda Fleming FNP-BC YY DemopolisBien CMA Zykee Lindor    History of Present Illness: Telemedicine visit for:  HFU for CAP, PE and positive COVID-19.  However patient is seeing [facility] and will be returning there for hospital follow up. He [facility]  Unfortunately he was not asked if he had a PCP during his hospital admission and was made an appointment here instead of with Ms. Yonjof who is his PCP.   I have notified [facility] of patient's recent hospital admission and dx of CAP/PE/COVID as he has a previously scheduled in office visit with them on 10-04-2018. They will call the patient regarding his appointment.  Patient is aware that  he will need to be referred to hematology for coagulopathy work up. He currently denies chest pain, shortness of breath, fever, nausea, vomiting, Wants to know when he can resume activity. I have instructed him that he should slowly resume his activities around the home only but not to over exert himself.   History reviewed. No pertinent past medical history.  Past Surgical History:  Procedure Laterality Date  . HERNIA REPAIR      Family History  Problem Relation Age of Onset  . Pancreatic cancer Mother   . Hypertension Father   . Hypertension Brother     Social History   Socioeconomic History  . Marital status: Single    Spouse name: Not on file  . Number of children: Not on file  . Years of education: Not on file  . Highest education level: Not on file  Occupational History  . Not on file  Social Needs  . Financial resource strain: Not on file  . Food insecurity    Worry: Not on file    Inability: Not on file  . Transportation needs    Medical: Not on file    Non-medical: Not on file  Tobacco Use  . Smoking status: Former Smoker    Types: Cigars  . Smokeless tobacco: Never Used  . Tobacco comment: Pt. stated he stop smoking 4 weeks ago.   Substance and Sexual Activity  . Alcohol use: Not Currently    Comment: beer a day   . Drug use: Yes  . Sexual activity: Not on file  Lifestyle  . Physical  activity    Days per week: Not on file    Minutes per session: Not on file  . Stress: Not on file  Relationships  . Social Herbalist on phone: Not on file    Gets together: Not on file    Attends religious service: Not on file    Active member of club or organization: Not on file    Attends meetings of clubs or organizations: Not on file    Relationship status: Not on file  Other Topics Concern  . Not on file  Social History Narrative  . Not on file     Observations/Objective: Awake, alert and oriented x 3   Review of Systems  Constitutional: Negative  for fever, malaise/fatigue and weight loss.  HENT: Negative.  Negative for nosebleeds.   Eyes: Negative.  Negative for blurred vision, double vision and photophobia.  Respiratory: Negative.  Negative for cough and shortness of breath.   Cardiovascular: Negative.  Negative for chest pain, palpitations and leg swelling.  Gastrointestinal: Negative.  Negative for heartburn, nausea and vomiting.  Musculoskeletal: Negative.  Negative for myalgias.  Neurological: Negative.  Negative for dizziness, focal weakness, seizures and headaches.  Psychiatric/Behavioral: Negative.  Negative for suicidal ideas.    Assessment and Plan: Jorey was seen today for hospitalization follow-up.  Diagnoses and all orders for this visit:  Hospital discharge follow-up Stable   COVID-19 virus detected Lives alone  PE (pulmonary thromboembolism) ([place]) Continue Xarelto. Will need hematology referral.   Community acquired pneumonia, unspecified laterality Stable. Denies chest pain, shortness of breath, cough, fever.     Follow Up Instructions No follow-ups on file.     I discussed the assessment and treatment plan with the patient. The patient was provided an opportunity to ask questions and all were answered. The patient agreed with the plan and demonstrated an understanding of the instructions.   The patient was advised to call back or seek an in-person evaluation if the symptoms worsen or if the condition fails to improve as anticipated.  I provided 24 minutes of non-face-to-face time during this encounter including median intraservice time, reviewing previous notes, labs, imaging, medications and explaining diagnosis and management.  Gildardo Pounds, FNP-BC

## 2018-11-01 ENCOUNTER — Telehealth: Payer: Self-pay | Admitting: Hematology and Oncology

## 2018-11-01 NOTE — Telephone Encounter (Signed)
Confirmed 8/10 new patient visit with Dr. Lindi Adie at 3:45 pm.

## 2018-11-17 NOTE — Progress Notes (Signed)
[facility] CONSULT NOTE  Patient Care Team: Patient, No Pcp Per as PCP - General (General Practice)  CHIEF COMPLAINTS/PURPOSE OF CONSULTATION:  Newly diagnosed pulmonary embolism  HISTORY OF PRESENTING ILLNESS:  Derek Norris 58 y.o. male is here because of recent diagnosis of pulmonary embolism. [facility]21[facility]18 for a COVID-19 infection and pulmonary embolism. CT angio chest on 09/19/18 showed acute pulmonary emboli with lobar involvement in the right lung. He was transitioned from IV Heparin to Xarelto and discharged. He presents to the clinic today for initial evaluation.  He tells me that he probably got COVID-19 when he was working for [employer].  He is recovered from the respiratory symptoms.  He denies any fevers or chills.  I reviewed his records extensively and collaborated the history with the patient.  MEDICAL HISTORY:  No past medical history on file.  SURGICAL HISTORY: Past Surgical History:  Procedure Laterality Date  . HERNIA REPAIR      SOCIAL HISTORY: Social History   Socioeconomic History  . Marital status: Single    Spouse name: Not on file  . Number of children: Not on file  . Years of education: Not on file  . Highest education level: Not on file  Occupational History  . Not on file  Social Needs  . Financial resource strain: Not on file  . Food insecurity    Worry: Not on file    Inability: Not on file  . Transportation needs    Medical: Not on file    Non-medical: Not on file  Tobacco Use  . Smoking status: Former Smoker    Types: Cigars  . Smokeless tobacco: Never Used  . Tobacco comment: Pt. stated he stop smoking 4 weeks ago.   Substance and Sexual Activity  . Alcohol use: Not Currently    Comment: beer a day   . Drug use: Yes  . Sexual activity: Not on file  Lifestyle  . Physical activity    Days per week: Not on file    Minutes per session: Not on file  . Stress: Not on file   Relationships  . Social Musicianconnections    Talks on phone: Not on file    Gets together: Not on file    Attends religious service: Not on file    Active member of club or organization: Not on file    Attends meetings of clubs or organizations: Not on file    Relationship status: Not on file  . Intimate partner violence    Fear of current or ex partner: Not on file    Emotionally abused: Not on file    Physically abused: Not on file    Forced sexual activity: Not on file  Other Topics Concern  . Not on file  Social History Narrative  . Not on file    FAMILY HISTORY: Family History  Problem Relation Age of Onset  . Pancreatic cancer Mother   . Hypertension Father   . Hypertension Brother     ALLERGIES:  has No Known Allergies.  MEDICATIONS:  Current Outpatient Medications  Medication Sig Dispense Refill  . albuterol (VENTOLIN HFA) 108 (90 Base) MCG/ACT inhaler Inhale 2 puffs into the lungs every 4 (four) hours as needed for wheezing or shortness of breath. (Patient not taking: Reported on 10/02/2018) 1 Inhaler 0  . benzonatate (TESSALON) 200 MG capsule Take 1 capsule (200 mg total) by mouth 3 (three) times daily. 20 capsule 0  .  rivaroxaban (XARELTO) 20 MG TABS tablet Take 1 tablet (20 mg total) by mouth daily with supper. 30 tablet 3   No current facility-administered medications for this visit.     REVIEW OF SYSTEMS:   Constitutional: Denies fevers, chills or abnormal night sweats Eyes: Denies blurriness of vision, double vision or watery eyes Ears, nose, mouth, throat, and face: Denies mucositis or sore throat Respiratory: Denies cough, dyspnea or wheezes Cardiovascular: Denies palpitation, chest discomfort or lower extremity swelling Gastrointestinal:  Denies nausea, heartburn or change in bowel habits Skin: Denies abnormal skin rashes Lymphatics: Denies new lymphadenopathy or easy bruising Neurological:Denies numbness, tingling or new weaknesses Behavioral/Psych: Mood  is stable, no new changes  All other systems were reviewed with the patient and are negative.  PHYSICAL EXAMINATION: ECOG PERFORMANCE STATUS: 0 - Asymptomatic  Vitals:   11/18/18 1555  BP: (!) 148/98  Pulse: 62  Resp: 20  Temp: 98.5 F (36.9 C)  SpO2: 100%   Filed Weights   11/18/18 1555  Weight: 174 lb 3.2 oz (79 kg)    GENERAL:alert, no distress and comfortable SKIN: skin color, texture, turgor are normal, no rashes or significant lesions EYES: normal, conjunctiva are pink and non-injected, sclera clear OROPHARYNX:no exudate, no erythema and lips, buccal mucosa, and tongue normal  NECK: supple, thyroid normal size, non-tender, without nodularity LYMPH:  no palpable lymphadenopathy in the cervical, axillary or inguinal LUNGS: clear to auscultation and percussion with normal breathing effort HEART: regular rate & rhythm and no murmurs and no lower extremity edema ABDOMEN:abdomen soft, non-tender and normal bowel sounds Musculoskeletal:no cyanosis of digits and no clubbing  PSYCH: alert & oriented x 3 with fluent speech NEURO: no focal motor/sensory deficits  LABORATORY DATA:  I have reviewed the data as listed Lab Results  Component Value Date   WBC 6.9 09/21/2018   HGB 12.6 (L) 09/21/2018   HCT 40.4 09/21/2018   MCV 94.4 09/21/2018   PLT 377 09/21/2018   Lab Results  Component Value Date   NA 139 09/21/2018   K 4.9 09/21/2018   CL 102 09/21/2018   CO2 28 09/21/2018    RADIOGRAPHIC STUDIES: I have personally reviewed the radiological reports and agreed with the findings in the report.  ASSESSMENT AND PLAN:  Pulmonary embolism ([place]) COVID-19 pneumonia required hospitalization June 2020 Incidentally found acute PE and bilateral lower extremity DVT Current treatment: Xarelto It appears that there was miscommunication and he is currently taking 15 mg of Xarelto daily. I sent a new prescription today for 20 mg of Xarelto.  Risk factors of blood clots: Other  than COVID-19 there was no other risk factor other than occasional cigar. Work-up needed: Lupus anticoagulant, factor V Leiden, prothrombin gene mutation, protein C and S and Antithrombin III.  I will follow-up with him in 2 weeks with Doximity video visit to discuss the results. Treatment plan: If no risk factors are found treatment plan is 6 months.  All questions were answered. The patient knows to call the clinic with any problems, questions or concerns.   Rulon Eisenmenger, MD 11/18/2018    I, Molly Dorshimer, am acting as scribe for Nicholas Lose, MD.  I have reviewed the above documentation for accuracy and completeness, and I agree with the above.

## 2018-11-18 ENCOUNTER — Other Ambulatory Visit: Payer: Self-pay

## 2018-11-18 ENCOUNTER — Inpatient Hospital Stay: Payer: BC Managed Care – PPO | Attending: Hematology and Oncology | Admitting: Hematology and Oncology

## 2018-11-18 DIAGNOSIS — I2699 Other pulmonary embolism without acute cor pulmonale: Secondary | ICD-10-CM

## 2018-11-18 DIAGNOSIS — Z79899 Other long term (current) drug therapy: Secondary | ICD-10-CM | POA: Insufficient documentation

## 2018-11-18 DIAGNOSIS — I82403 Acute embolism and thrombosis of unspecified deep veins of lower extremity, bilateral: Secondary | ICD-10-CM | POA: Diagnosis not present

## 2018-11-18 DIAGNOSIS — J1289 Other viral pneumonia: Secondary | ICD-10-CM | POA: Diagnosis not present

## 2018-11-18 DIAGNOSIS — U071 COVID-19: Secondary | ICD-10-CM | POA: Insufficient documentation

## 2018-11-18 DIAGNOSIS — Z7901 Long term (current) use of anticoagulants: Secondary | ICD-10-CM | POA: Insufficient documentation

## 2018-11-18 MED ORDER — RIVAROXABAN 20 MG PO TABS: 20.0000 mg | ORAL_TABLET | Freq: Every day | ORAL | 3 refills | Status: DC

## 2018-11-18 NOTE — Assessment & Plan Note (Signed)
COVID-19 pneumonia required hospitalization June 2020 Incidentally found acute PE and bilateral lower extremity DVT Current treatment: Xarelto  Risk factors of blood clots: Other than COVID-19 there was no other risk factor. Work-up needed: Lupus anticoagulant, factor V Leiden, prothrombin gene mutation, protein C and S and Antithrombin III.  I will follow-up with him in 2 weeks with Doximity video visit to discuss the results. Treatment plan: If no risk factors are found treatment plan is 6 months.

## 2018-11-19 ENCOUNTER — Telehealth: Payer: Self-pay | Admitting: Hematology and Oncology

## 2018-11-19 ENCOUNTER — Inpatient Hospital Stay: Payer: BC Managed Care – PPO

## 2018-11-19 DIAGNOSIS — I2699 Other pulmonary embolism without acute cor pulmonale: Secondary | ICD-10-CM

## 2018-11-19 NOTE — Telephone Encounter (Signed)
I left a message regarding visit °

## 2018-11-20 LAB — PROTEIN S, TOTAL: Protein S Ag, Total: 83 % (ref 60–150)

## 2018-11-21 LAB — LUPUS ANTICOAGULANT PANEL
DRVVT: 128.6 s — ABNORMAL HIGH (ref 0.0–47.0)
PTT Lupus Anticoagulant: 48.5 s (ref 0.0–51.9)

## 2018-11-21 LAB — DRVVT MIX: dRVVT Mix: 78.2 s — ABNORMAL HIGH (ref 0.0–47.0)

## 2018-11-21 LAB — PROTEIN C, TOTAL: Protein C, Total: 120 % (ref 60–150)

## 2018-11-21 LAB — DRVVT CONFIRM: dRVVT Confirm: 1.9 ratio — ABNORMAL HIGH (ref 0.8–1.2)

## 2018-11-25 NOTE — Assessment & Plan Note (Signed)
COVID-19 pneumonia required hospitalization June 2020 Incidentally found acute PE and bilateral lower extremity DVT Current treatment: Xarelto  Hypercoagulability work-up: Positive for lupus anticoagulant Negative for protein C and protein S deficiencies. Factor V Leiden and prothrombin gene mutations are pending.  I explained to the patient in great detail how the antiphospholipid body syndrome leads to excess of thrombosis.  However in order to definitively diagnose him with anti-possible antibody syndrome the test needs to be repeated in 3 months.  He understands that if it is persistently positive then he will need to remain on anticoagulation for life.  Return to clinic in 3 months with labs done ahead of time.

## 2018-11-26 LAB — FACTOR 5 LEIDEN

## 2018-11-27 LAB — PROTHROMBIN GENE MUTATION

## 2018-12-01 NOTE — Progress Notes (Signed)
HEMATOLOGY-ONCOLOGY DOXIMITY VISIT PROGRESS NOTE  I connected with Derek Norris on 12/02/2018 at 11:30 AM EDT by Doximity video conference and verified that I am speaking with the correct person using two identifiers.  I discussed the limitations, risks, security and privacy concerns of performing an evaluation and management service by Doximity and the availability of in person appointments.  I also discussed with the patient that there may be a patient responsible charge related to this service. The patient expressed understanding and agreed to proceed.  Patient's Location: Home Physician Location: Clinic  CHIEF COMPLIANT: Follow-up of PE to discuss lab results  INTERVAL HISTORY: Derek Norris is a 58 y.o. male with above-mentioned history of pulmonary embolism currently on Xarelto. Work-up on 11/19/18 showed: PTgene mutation negative, Factor 5 Leiden negative, lupus anticoagulant positive, protein C 120, protein S 83, dRVVT mix 78.2, dRVVT confirm 1.9. He presents over Doximity today for follow-up to discuss his lab results.    REVIEW OF SYSTEMS:   Constitutional: Denies fevers, chills or abnormal weight loss Eyes: Denies blurriness of vision Ears, nose, mouth, throat, and face: Denies mucositis or sore throat Respiratory: Denies cough, dyspnea or wheezes Cardiovascular: Denies palpitation, chest discomfort Gastrointestinal:  Denies nausea, heartburn or change in bowel habits Skin: Denies abnormal skin rashes Lymphatics: Denies new lymphadenopathy or easy bruising Neurological:Denies numbness, tingling or new weaknesses Behavioral/Psych: Mood is stable, no new changes  Extremities: No lower extremity edema All other systems were reviewed with the patient and are negative.  Observations/Objective:  There were no vitals filed for this visit. There is no height or weight on file to calculate BMI.  I have reviewed the data as listed CMP Latest Ref Rng & Units 09/21/2018 09/20/2018  09/19/2018  Glucose 70 - 99 mg/dL 215(H) - 142(H)  BUN 6 - 20 mg/dL 16 - 19  Creatinine 0.61 - 1.24 mg/dL 0.80 - 1.00  Sodium 135 - 145 mmol/L 139 - 139  Potassium 3.5 - 5.1 mmol/L 4.9 - 3.5  Chloride 98 - 111 mmol/L 102 - 99  CO2 22 - 32 mmol/L 28 - 26  Calcium 8.9 - 10.3 mg/dL 9.1 - 8.8(L)  Total Protein 6.5 - 8.1 g/dL 7.1 6.8 -  Total Bilirubin 0.3 - 1.2 mg/dL 0.4 0.3 -  Alkaline Phos 38 - 126 U/L 48 45 -  AST 15 - 41 U/L 17 16 -  ALT 0 - 44 U/L 15 14 -    Lab Results  Component Value Date   WBC 6.9 09/21/2018   HGB 12.6 (L) 09/21/2018   HCT 40.4 09/21/2018   MCV 94.4 09/21/2018   PLT 377 09/21/2018   NEUTROABS 6.0 09/21/2018      Assessment Plan:  Pulmonary embolism ([place]) COVID-19 pneumonia required hospitalization June 2020 Incidentally found acute PE and bilateral lower extremity DVT Current treatment: Xarelto  Hypercoagulability work-up: Positive for lupus anticoagulant Negative for protein C and protein S deficiencies. Factor V Leiden and prothrombin gene mutations are Negative  I explained to the patient in great detail how the antiphospholipid body syndrome leads to excess of thrombosis.  However in order to definitively diagnose him with anti-possible antibody syndrome the test needs to be repeated in 3 months.  He understands that if it is persistently positive then he will need to remain on anticoagulation for life.  Return to clinic in 3 months with labs done ahead of time.   I discussed the assessment and treatment plan with the patient. The patient was provided an opportunity to  ask questions and all were answered. The patient agreed with the plan and demonstrated an understanding of the instructions. The patient was advised to call back or seek an in-person evaluation if the symptoms worsen or if the condition fails to improve as anticipated.   I provided 15 minutes of face-to-face Doximity time during this encounter.    Sabas SousVinay K Daisy Lites, MD 12/02/2018    I, Molly Dorshimer, am acting as scribe for Serena CroissantVinay Omnia Dollinger, MD.  I have reviewed the above documentation for accuracy and completeness, and I agree with the above.

## 2018-12-02 ENCOUNTER — Inpatient Hospital Stay (HOSPITAL_BASED_OUTPATIENT_CLINIC_OR_DEPARTMENT_OTHER): Payer: BC Managed Care – PPO | Admitting: Hematology and Oncology

## 2018-12-02 DIAGNOSIS — Z7901 Long term (current) use of anticoagulants: Secondary | ICD-10-CM

## 2018-12-02 DIAGNOSIS — I2699 Other pulmonary embolism without acute cor pulmonale: Secondary | ICD-10-CM

## 2018-12-03 ENCOUNTER — Telehealth: Payer: Self-pay | Admitting: Hematology and Oncology

## 2018-12-03 NOTE — Telephone Encounter (Signed)
I left a message regarding schedule I will mail °

## 2018-12-04 ENCOUNTER — Encounter: Payer: Self-pay | Admitting: *Deleted

## 2019-01-10 ENCOUNTER — Other Ambulatory Visit: Payer: Self-pay | Admitting: Hematology and Oncology

## 2019-03-04 ENCOUNTER — Other Ambulatory Visit: Payer: Self-pay

## 2019-03-04 ENCOUNTER — Inpatient Hospital Stay: Payer: BC Managed Care – PPO | Attending: Hematology and Oncology

## 2019-03-04 DIAGNOSIS — I82403 Acute embolism and thrombosis of unspecified deep veins of lower extremity, bilateral: Secondary | ICD-10-CM | POA: Diagnosis not present

## 2019-03-04 DIAGNOSIS — J1289 Other viral pneumonia: Secondary | ICD-10-CM | POA: Insufficient documentation

## 2019-03-04 DIAGNOSIS — Z79899 Other long term (current) drug therapy: Secondary | ICD-10-CM | POA: Diagnosis not present

## 2019-03-04 DIAGNOSIS — Z7901 Long term (current) use of anticoagulants: Secondary | ICD-10-CM | POA: Diagnosis not present

## 2019-03-04 DIAGNOSIS — I2699 Other pulmonary embolism without acute cor pulmonale: Secondary | ICD-10-CM | POA: Diagnosis present

## 2019-03-04 DIAGNOSIS — U071 COVID-19: Secondary | ICD-10-CM | POA: Insufficient documentation

## 2019-03-08 LAB — DRVVT MIX: dRVVT Mix: 64.4 s — ABNORMAL HIGH (ref 0.0–47.0)

## 2019-03-08 LAB — LUPUS ANTICOAGULANT PANEL
DRVVT: 89.2 s — ABNORMAL HIGH (ref 0.0–47.0)
PTT Lupus Anticoagulant: 38.3 s (ref 0.0–51.9)

## 2019-03-08 LAB — DRVVT CONFIRM: dRVVT Confirm: 1.6 ratio — ABNORMAL HIGH (ref 0.8–1.2)

## 2019-03-10 NOTE — Progress Notes (Signed)
HEMATOLOGY-ONCOLOGY DOXIMITY VISIT PROGRESS NOTE  I connected with Derek Norris on 03/11/2019 at 10:00 AM EST by Doximity video conference and verified that I am speaking with the correct person using two identifiers.  I discussed the limitations, risks, security and privacy concerns of performing an evaluation and management service by Doximity and the availability of in person appointments.  I also discussed with the patient that there may be a patient responsible charge related to this service. The patient expressed understanding and agreed to proceed.  Patient's Location: Home Physician Location: Clinic  CHIEF COMPLIANT: Follow-up of PE to discuss lab results  INTERVAL HISTORY: Derek Norris is a 58 y.o. male with above-mentioned history of pulmonary embolism currently on Xarelto. Labs on 03/04/19 showed lupus anticoagulant positive. He presents to the clinic today to review his labs.    REVIEW OF SYSTEMS:   Constitutional: Denies fevers, chills or abnormal weight loss Eyes: Denies blurriness of vision Ears, nose, mouth, throat, and face: Denies mucositis or sore throat Respiratory: Denies cough, dyspnea or wheezes Cardiovascular: Denies palpitation, chest discomfort Gastrointestinal:  Denies nausea, heartburn or change in bowel habits Skin: Denies abnormal skin rashes Lymphatics: Denies new lymphadenopathy or easy bruising Neurological:Denies numbness, tingling or new weaknesses Behavioral/Psych: Mood is stable, no new changes  Extremities: No lower extremity edema All other systems were reviewed with the patient and are negative.  Observations/Objective:   I have reviewed the data as listed CMP Latest Ref Rng & Units 09/21/2018 09/20/2018 09/19/2018  Glucose 70 - 99 mg/dL 215(H) - 142(H)  BUN 6 - 20 mg/dL 16 - 19  Creatinine 0.61 - 1.24 mg/dL 0.80 - 1.00  Sodium 135 - 145 mmol/L 139 - 139  Potassium 3.5 - 5.1 mmol/L 4.9 - 3.5  Chloride 98 - 111 mmol/L 102 - 99  CO2 22 - 32  mmol/L 28 - 26  Calcium 8.9 - 10.3 mg/dL 9.1 - 8.8(L)  Total Protein 6.5 - 8.1 g/dL 7.1 6.8 -  Total Bilirubin 0.3 - 1.2 mg/dL 0.4 0.3 -  Alkaline Phos 38 - 126 U/L 48 45 -  AST 15 - 41 U/L 17 16 -  ALT 0 - 44 U/L 15 14 -    Lab Results  Component Value Date   WBC 6.9 09/21/2018   HGB 12.6 (L) 09/21/2018   HCT 40.4 09/21/2018   MCV 94.4 09/21/2018   PLT 377 09/21/2018   NEUTROABS 6.0 09/21/2018      Assessment Plan:  Pulmonary embolism ([place]) COVID-19 pneumonia required hospitalization June 2020 Incidentally found acute PE and bilateral lower extremity DVT Current treatment: Xarelto  Hypercoagulability work-up: Positive for lupus anticoagulant Negative for protein C and protein S deficiencies. Factor V Leiden and prothrombin gene mutations are Negative  Confirmatory testing 03/04/2019: Consistent with lupus anticoagulant.  Recommendation: Anticoagulation for life Current treatment: Xarelto I discussed with the patient that by guidelines Xarelto is inferior to Coumadin.  Therefore we would like to switch him to Coumadin.    I discussed the assessment and treatment plan with the patient. The patient was provided an opportunity to ask questions and all were answered. The patient agreed with the plan and demonstrated an understanding of the instructions. The patient was advised to call back or seek an in-person evaluation if the symptoms worsen or if the condition fails to improve as anticipated.   I provided 11 minutes of face-to-face Doximity time during this encounter.    Rulon Eisenmenger, MD 03/11/2019   I, Molly Dorshimer, am acting  as scribe for Nicholas Lose, MD.  I have reviewed the above documentation for accuracy and completeness, and I agree with the above.

## 2019-03-11 ENCOUNTER — Inpatient Hospital Stay: Payer: BC Managed Care – PPO | Attending: Hematology and Oncology | Admitting: Hematology and Oncology

## 2019-03-11 DIAGNOSIS — Z79899 Other long term (current) drug therapy: Secondary | ICD-10-CM | POA: Insufficient documentation

## 2019-03-11 DIAGNOSIS — Z8701 Personal history of pneumonia (recurrent): Secondary | ICD-10-CM | POA: Insufficient documentation

## 2019-03-11 DIAGNOSIS — Z86711 Personal history of pulmonary embolism: Secondary | ICD-10-CM | POA: Insufficient documentation

## 2019-03-11 DIAGNOSIS — I2699 Other pulmonary embolism without acute cor pulmonale: Secondary | ICD-10-CM | POA: Diagnosis not present

## 2019-03-11 DIAGNOSIS — D6861 Antiphospholipid syndrome: Secondary | ICD-10-CM

## 2019-03-11 DIAGNOSIS — Z7901 Long term (current) use of anticoagulants: Secondary | ICD-10-CM | POA: Insufficient documentation

## 2019-03-11 DIAGNOSIS — D6851 Activated protein C resistance: Secondary | ICD-10-CM | POA: Insufficient documentation

## 2019-03-11 MED ORDER — WARFARIN SODIUM 5 MG PO TABS: 5.0000 mg | ORAL_TABLET | Freq: Every day | ORAL | 3 refills | Status: DC

## 2019-03-11 MED ORDER — WARFARIN SODIUM 5 MG PO TABS: 5.0000 mg | ORAL_TABLET | Freq: Once | ORAL | 0 refills | Status: DC

## 2019-03-11 NOTE — Assessment & Plan Note (Signed)
COVID-19 pneumonia required hospitalization June 2020 Incidentally found acute PE and bilateral lower extremity DVT Current treatment: Xarelto  Hypercoagulability work-up: Positive for lupus anticoagulant Negative for protein C and protein S deficiencies. Factor V Leiden and prothrombin gene mutations are Negative  Confirmatory testing 03/04/2019: Consistent with lupus anticoagulant.  Recommendation: Anticoagulation for life Current treatment: Xarelto I discussed with the patient that by guidelines Xarelto is inferior to Coumadin.  Therefore we would like to switch him to Coumadin.

## 2019-03-12 ENCOUNTER — Telehealth: Payer: Self-pay | Admitting: Hematology and Oncology

## 2019-03-12 NOTE — Telephone Encounter (Signed)
I left a message regarding schedule  

## 2019-03-18 ENCOUNTER — Inpatient Hospital Stay: Payer: BC Managed Care – PPO

## 2019-03-18 ENCOUNTER — Other Ambulatory Visit: Payer: Self-pay

## 2019-03-18 DIAGNOSIS — D6861 Antiphospholipid syndrome: Secondary | ICD-10-CM

## 2019-03-18 DIAGNOSIS — Z79899 Other long term (current) drug therapy: Secondary | ICD-10-CM | POA: Diagnosis not present

## 2019-03-18 DIAGNOSIS — Z86711 Personal history of pulmonary embolism: Secondary | ICD-10-CM | POA: Diagnosis present

## 2019-03-18 DIAGNOSIS — I2699 Other pulmonary embolism without acute cor pulmonale: Secondary | ICD-10-CM

## 2019-03-18 DIAGNOSIS — Z8701 Personal history of pneumonia (recurrent): Secondary | ICD-10-CM | POA: Diagnosis not present

## 2019-03-18 DIAGNOSIS — D6851 Activated protein C resistance: Secondary | ICD-10-CM | POA: Diagnosis not present

## 2019-03-18 DIAGNOSIS — Z7901 Long term (current) use of anticoagulants: Secondary | ICD-10-CM | POA: Diagnosis not present

## 2019-03-18 LAB — PROTIME-INR
INR: 1.5 — ABNORMAL HIGH (ref 0.8–1.2)
Prothrombin Time: 18.1 seconds — ABNORMAL HIGH (ref 11.4–15.2)

## 2019-03-25 ENCOUNTER — Other Ambulatory Visit: Payer: Self-pay

## 2019-03-25 ENCOUNTER — Inpatient Hospital Stay: Payer: BC Managed Care – PPO

## 2019-03-25 DIAGNOSIS — I2699 Other pulmonary embolism without acute cor pulmonale: Secondary | ICD-10-CM

## 2019-03-25 DIAGNOSIS — Z86711 Personal history of pulmonary embolism: Secondary | ICD-10-CM | POA: Diagnosis not present

## 2019-03-25 DIAGNOSIS — D6861 Antiphospholipid syndrome: Secondary | ICD-10-CM

## 2019-03-25 LAB — PROTIME-INR
INR: 2.7 — ABNORMAL HIGH (ref 0.8–1.2)
Prothrombin Time: 28.7 seconds — ABNORMAL HIGH (ref 11.4–15.2)

## 2019-04-01 ENCOUNTER — Inpatient Hospital Stay: Payer: BC Managed Care – PPO

## 2019-04-01 ENCOUNTER — Other Ambulatory Visit: Payer: Self-pay

## 2019-04-01 DIAGNOSIS — I2699 Other pulmonary embolism without acute cor pulmonale: Secondary | ICD-10-CM

## 2019-04-01 DIAGNOSIS — Z86711 Personal history of pulmonary embolism: Secondary | ICD-10-CM | POA: Diagnosis not present

## 2019-04-01 DIAGNOSIS — D6861 Antiphospholipid syndrome: Secondary | ICD-10-CM

## 2019-04-01 LAB — PROTIME-INR
INR: 2.1 — ABNORMAL HIGH (ref 0.8–1.2)
Prothrombin Time: 23.6 seconds — ABNORMAL HIGH (ref 11.4–15.2)

## 2019-04-07 NOTE — Progress Notes (Signed)
Patient Care Team: Patient, No Pcp Per as PCP - General (General Practice)  DIAGNOSIS:    ICD-10-CM   1. Other acute pulmonary embolism without acute cor pulmonale (HCC)  I26.99     CHIEF COMPLIANT: Follow-up of PE on Coumadin  INTERVAL HISTORY: Derek Norris is a 58 y.o. with above-mentioned history of pulmonary embolism currently on Coumadin. He presents to the clinic today to review his labs.    Is tolerating Coumadin extremely well.  His INR is therapeutic.  He does not have any bleeding symptoms.  REVIEW OF SYSTEMS:   Constitutional: Denies fevers, chills or abnormal weight loss Eyes: Denies blurriness of vision Ears, nose, mouth, throat, and face: Denies mucositis or sore throat Respiratory: Denies cough, dyspnea or wheezes Cardiovascular: Denies palpitation, chest discomfort Gastrointestinal: Denies nausea, heartburn or change in bowel habits Skin: Denies abnormal skin rashes Lymphatics: Denies new lymphadenopathy or easy bruising Neurological: Denies numbness, tingling or new weaknesses Behavioral/Psych: Mood is stable, no new changes  Extremities: No lower extremity edema   All other systems were reviewed with the patient and are negative.  I have reviewed the past medical history, past surgical history, social history and family history with the patient and they are unchanged from previous note.  ALLERGIES:  has No Known Allergies.  MEDICATIONS:  Current Outpatient Medications  Medication Sig Dispense Refill  . albuterol (VENTOLIN HFA) 108 (90 Base) MCG/ACT inhaler Inhale 2 puffs into the lungs every 4 (four) hours as needed for wheezing or shortness of breath. (Patient not taking: Reported on 10/02/2018) 1 Inhaler 0  . benzonatate (TESSALON) 200 MG capsule Take 1 capsule (200 mg total) by mouth 3 (three) times daily. 20 capsule 0  . warfarin (COUMADIN) 5 MG tablet Take 1 tablet (5 mg total) by mouth daily. 30 tablet 11   No current facility-administered medications  for this visit.    PHYSICAL EXAMINATION: ECOG PERFORMANCE STATUS: 1 - Symptomatic but completely ambulatory  Vitals:   04/08/19 1051  BP: (!) 172/93  Pulse: 61  Temp: 98.3 F (36.8 C)  SpO2: 100%   Filed Weights   04/08/19 1051  Weight: 185 lb 6.4 oz (84.1 kg)    GENERAL: alert, no distress and comfortable SKIN: skin color, texture, turgor are normal, no rashes or significant lesions EYES: normal, Conjunctiva are pink and non-injected, sclera clear OROPHARYNX: no exudate, no erythema and lips, buccal mucosa, and tongue normal  NECK: supple, thyroid normal size, non-tender, without nodularity LYMPH: no palpable lymphadenopathy in the cervical, axillary or inguinal LUNGS: clear to auscultation and percussion with normal breathing effort HEART: regular rate & rhythm and no murmurs and no lower extremity edema ABDOMEN: abdomen soft, non-tender and normal bowel sounds MUSCULOSKELETAL: no cyanosis of digits and no clubbing  NEURO: alert & oriented x 3 with fluent speech, no focal motor/sensory deficits EXTREMITIES: No lower extremity edema  LABORATORY DATA:  I have reviewed the data as listed CMP Latest Ref Rng & Units 09/21/2018 09/20/2018 09/19/2018  Glucose 70 - 99 mg/dL 215(H) - 142(H)  BUN 6 - 20 mg/dL 16 - 19  Creatinine 0.61 - 1.24 mg/dL 0.80 - 1.00  Sodium 135 - 145 mmol/L 139 - 139  Potassium 3.5 - 5.1 mmol/L 4.9 - 3.5  Chloride 98 - 111 mmol/L 102 - 99  CO2 22 - 32 mmol/L 28 - 26  Calcium 8.9 - 10.3 mg/dL 9.1 - 8.8(L)  Total Protein 6.5 - 8.1 g/dL 7.1 6.8 -  Total Bilirubin 0.3 - 1.2  mg/dL 0.4 0.3 -  Alkaline Phos 38 - 126 U/L 48 45 -  AST 15 - 41 U/L 17 16 -  ALT 0 - 44 U/L 15 14 -    Lab Results  Component Value Date   WBC 6.9 09/21/2018   HGB 12.6 (L) 09/21/2018   HCT 40.4 09/21/2018   MCV 94.4 09/21/2018   PLT 377 09/21/2018   NEUTROABS 6.0 09/21/2018    ASSESSMENT & PLAN:  Pulmonary embolism ([facility]) COVID-19 pneumonia required hospitalization June  2020 Incidentally found acute PE and bilateral lower extremity DVT Current treatment: Xarelto  Hypercoagulability work-up: Positive for lupus anticoagulant Negative for protein C and protein S deficiencies. Factor V Leiden and prothrombin gene mutations areNegative  Confirmatory testing 03/04/2019: Consistent with lupus anticoagulant.  Recommendation: Anticoagulation for life Current treatment: Coumadin Lab monitoring: INR 2.3 04/08/2019  Maintain current dose of Coumadin. Patient is currently off work.  He was working at [employer]. Labs once a month for the next 3 months and I will see the patient back in 3 months for follow-up.    No orders of the defined types were placed in this encounter.  The patient has a good understanding of the overall plan. he agrees with it. he will call with any problems that may develop before the next visit here.  Serena Croissant, MD 04/08/2019  Jenness Corner Dorshimer, am acting as scribe for Dr. Serena Croissant.  I have reviewed the above document for accuracy and completeness, and I agree with the above.

## 2019-04-08 ENCOUNTER — Other Ambulatory Visit: Payer: Self-pay

## 2019-04-08 ENCOUNTER — Inpatient Hospital Stay: Payer: BC Managed Care – PPO

## 2019-04-08 ENCOUNTER — Inpatient Hospital Stay (HOSPITAL_BASED_OUTPATIENT_CLINIC_OR_DEPARTMENT_OTHER): Payer: BC Managed Care – PPO | Admitting: Hematology and Oncology

## 2019-04-08 DIAGNOSIS — I2699 Other pulmonary embolism without acute cor pulmonale: Secondary | ICD-10-CM

## 2019-04-08 DIAGNOSIS — D6861 Antiphospholipid syndrome: Secondary | ICD-10-CM

## 2019-04-08 DIAGNOSIS — Z86711 Personal history of pulmonary embolism: Secondary | ICD-10-CM | POA: Diagnosis not present

## 2019-04-08 LAB — PROTIME-INR
INR: 2.3 — ABNORMAL HIGH (ref 0.8–1.2)
Prothrombin Time: 25.2 seconds — ABNORMAL HIGH (ref 11.4–15.2)

## 2019-04-08 MED ORDER — WARFARIN SODIUM 5 MG PO TABS: 5.0000 mg | ORAL_TABLET | Freq: Every day | ORAL | 11 refills | Status: DC

## 2019-04-08 NOTE — Assessment & Plan Note (Signed)
COVID-19 pneumonia required hospitalization June 2020 Incidentally found acute PE and bilateral lower extremity DVT Current treatment: Xarelto  Hypercoagulability work-up: Positive for lupus anticoagulant Negative for protein C and protein S deficiencies. Factor V Leiden and prothrombin gene mutations areNegative  Confirmatory testing 03/04/2019: Consistent with lupus anticoagulant.  Recommendation: Anticoagulation for life Current treatment: Coumadin Lab monitoring: INR 2.1 04/01/2019  Maintain current dose of Coumadin. Return to clinic in 6 months for follow-up

## 2019-04-14 ENCOUNTER — Telehealth: Payer: Self-pay | Admitting: Hematology and Oncology

## 2019-04-14 NOTE — Telephone Encounter (Signed)
Scheduled per 12/29 los, left a voicemail. °

## 2019-05-09 ENCOUNTER — Inpatient Hospital Stay: Payer: BC Managed Care – PPO | Attending: Hematology and Oncology

## 2019-05-09 ENCOUNTER — Other Ambulatory Visit: Payer: Self-pay

## 2019-05-09 DIAGNOSIS — I2699 Other pulmonary embolism without acute cor pulmonale: Secondary | ICD-10-CM

## 2019-05-09 DIAGNOSIS — Z7901 Long term (current) use of anticoagulants: Secondary | ICD-10-CM | POA: Diagnosis not present

## 2019-05-09 DIAGNOSIS — Z86711 Personal history of pulmonary embolism: Secondary | ICD-10-CM | POA: Diagnosis not present

## 2019-05-09 DIAGNOSIS — D6861 Antiphospholipid syndrome: Secondary | ICD-10-CM

## 2019-05-09 LAB — PROTIME-INR
INR: 2.1 — ABNORMAL HIGH (ref 0.8–1.2)
Prothrombin Time: 23.2 seconds — ABNORMAL HIGH (ref 11.4–15.2)

## 2019-06-06 ENCOUNTER — Other Ambulatory Visit: Payer: Self-pay

## 2019-06-06 ENCOUNTER — Inpatient Hospital Stay: Payer: BC Managed Care – PPO | Attending: Hematology and Oncology

## 2019-06-06 DIAGNOSIS — Z7901 Long term (current) use of anticoagulants: Secondary | ICD-10-CM | POA: Diagnosis not present

## 2019-06-06 DIAGNOSIS — Z86711 Personal history of pulmonary embolism: Secondary | ICD-10-CM | POA: Insufficient documentation

## 2019-06-06 DIAGNOSIS — Z86718 Personal history of other venous thrombosis and embolism: Secondary | ICD-10-CM | POA: Diagnosis not present

## 2019-06-06 DIAGNOSIS — I2699 Other pulmonary embolism without acute cor pulmonale: Secondary | ICD-10-CM

## 2019-06-06 DIAGNOSIS — D6861 Antiphospholipid syndrome: Secondary | ICD-10-CM

## 2019-06-06 LAB — PROTIME-INR
INR: 2.1 — ABNORMAL HIGH (ref 0.8–1.2)
Prothrombin Time: 23.4 seconds — ABNORMAL HIGH (ref 11.4–15.2)

## 2019-07-03 NOTE — Progress Notes (Signed)
Patient Care Team: Patient, No Pcp Per as PCP - General (General Practice)  DIAGNOSIS:    ICD-10-CM   1. Multiple subsegmental pulmonary emboli without acute cor pulmonale (HCC)  I26.94     CHIEF COMPLIANT: Follow-up of PE on Coumadin  INTERVAL HISTORY: Derek Norris is a 59 y.o. with above-mentioned history of pulmonary embolism currently on Coumadin.He presents to the clinic todayto review his labs.  ALLERGIES:  has No Known Allergies.  MEDICATIONS:  Current Outpatient Medications  Medication Sig Dispense Refill  . albuterol (VENTOLIN HFA) 108 (90 Base) MCG/ACT inhaler Inhale 2 puffs into the lungs every 4 (four) hours as needed for wheezing or shortness of breath. (Patient not taking: Reported on 10/02/2018) 1 Inhaler 0  . benzonatate (TESSALON) 200 MG capsule Take 1 capsule (200 mg total) by mouth 3 (three) times daily. 20 capsule 0  . miconazole (MICATIN) 2 % cream Apply 1 application topically 2 (two) times daily. 28.35 g 0  . warfarin (COUMADIN) 5 MG tablet Take 1 tablet (5 mg total) by mouth daily. 30 tablet 11   No current facility-administered medications for this visit.    PHYSICAL EXAMINATION: ECOG PERFORMANCE STATUS: 1 - Symptomatic but completely ambulatory  Vitals:   07/04/19 1035  BP: (!) 159/96  Pulse: 78  Resp: 18  Temp: 97.8 F (36.6 C)  SpO2: 95%   Filed Weights   07/04/19 1035  Weight: 184 lb 9.6 oz (83.7 kg)    LABORATORY DATA:  I have reviewed the data as listed CMP Latest Ref Rng & Units 09/21/2018 09/20/2018 09/19/2018  Glucose 70 - 99 mg/dL 536(U) - 440(H)  BUN 6 - 20 mg/dL 16 - 19  Creatinine 4.74 - 1.24 mg/dL 2.59 - 5.63  Sodium 875 - 145 mmol/L 139 - 139  Potassium 3.5 - 5.1 mmol/L 4.9 - 3.5  Chloride 98 - 111 mmol/L 102 - 99  CO2 22 - 32 mmol/L 28 - 26  Calcium 8.9 - 10.3 mg/dL 9.1 - 8.8(L)  Total Protein 6.5 - 8.1 g/dL 7.1 6.8 -  Total Bilirubin 0.3 - 1.2 mg/dL 0.4 0.3 -  Alkaline Phos 38 - 126 U/L 48 45 -  AST 15 - 41 U/L 17 16  -  ALT 0 - 44 U/L 15 14 -    Lab Results  Component Value Date   WBC 6.9 09/21/2018   HGB 12.6 (L) 09/21/2018   HCT 40.4 09/21/2018   MCV 94.4 09/21/2018   PLT 377 09/21/2018   NEUTROABS 6.0 09/21/2018    ASSESSMENT & PLAN:  Pulmonary embolus (HCC) COVID-19 pneumonia required hospitalization June 2020 Incidentally found acute PE and bilateral lower extremity DVT Current treatment: Xarelto  Hypercoagulability work-up: Positive for lupus anticoagulant Negative for protein C and protein S deficiencies. Factor V Leiden and prothrombin gene mutations areNegative  Confirmatory testing 03/04/2019: Consistent with lupus anticoagulant.  Recommendation: Anticoagulation for life Current treatment: Coumadin Lab monitoring: INR 2.2  on 07/04/2019 Continue follow-up labs with INR every other month  Maintain current dose of Coumadin.  Return to clinic in 6 months for follow-up.    No orders of the defined types were placed in this encounter.  The patient has a good understanding of the overall plan. he agrees with it. he will call with any problems that may develop before the next visit here.  Total time spent: 20 mins including face to face time and time spent for planning, charting and coordination of care  Serena Croissant, MD 07/04/2019  I,  Molly Dorshimer, am acting as scribe for Dr. Bernard Donahoo.  I have reviewed the above documentation for accuracy and completeness, and I agree with the above.       

## 2019-07-04 ENCOUNTER — Inpatient Hospital Stay (HOSPITAL_BASED_OUTPATIENT_CLINIC_OR_DEPARTMENT_OTHER): Payer: BC Managed Care – PPO | Admitting: Hematology and Oncology

## 2019-07-04 ENCOUNTER — Inpatient Hospital Stay: Payer: BC Managed Care – PPO

## 2019-07-04 ENCOUNTER — Other Ambulatory Visit: Payer: Self-pay

## 2019-07-04 ENCOUNTER — Inpatient Hospital Stay: Payer: BC Managed Care – PPO | Attending: Hematology and Oncology

## 2019-07-04 ENCOUNTER — Inpatient Hospital Stay: Payer: BC Managed Care – PPO | Admitting: Hematology and Oncology

## 2019-07-04 DIAGNOSIS — Z8616 Personal history of COVID-19: Secondary | ICD-10-CM | POA: Insufficient documentation

## 2019-07-04 DIAGNOSIS — I2699 Other pulmonary embolism without acute cor pulmonale: Secondary | ICD-10-CM

## 2019-07-04 DIAGNOSIS — Z86711 Personal history of pulmonary embolism: Secondary | ICD-10-CM | POA: Diagnosis present

## 2019-07-04 DIAGNOSIS — I2694 Multiple subsegmental pulmonary emboli without acute cor pulmonale: Secondary | ICD-10-CM | POA: Diagnosis not present

## 2019-07-04 DIAGNOSIS — Z7901 Long term (current) use of anticoagulants: Secondary | ICD-10-CM | POA: Insufficient documentation

## 2019-07-04 DIAGNOSIS — D6861 Antiphospholipid syndrome: Secondary | ICD-10-CM

## 2019-07-04 LAB — PROTIME-INR
INR: 2.2 — ABNORMAL HIGH (ref 0.8–1.2)
Prothrombin Time: 24.2 seconds — ABNORMAL HIGH (ref 11.4–15.2)

## 2019-07-04 MED ORDER — MICONAZOLE NITRATE 2 % EX CREA: 1.0000 "application " | TOPICAL_CREAM | Freq: Two times a day (BID) | CUTANEOUS | 0 refills | Status: DC

## 2019-07-04 NOTE — Assessment & Plan Note (Signed)
COVID-19 pneumonia required hospitalization June 2020 Incidentally found acute PE and bilateral lower extremity DVT Current treatment: Xarelto  Hypercoagulability work-up: Positive for lupus anticoagulant Negative for protein C and protein S deficiencies. Factor V Leiden and prothrombin gene mutations areNegative  Confirmatory testing 03/04/2019: Consistent with lupus anticoagulant.  Recommendation: Anticoagulation for life Current treatment: Coumadin Lab monitoring: INR 2.1  06/06/2019 Continue follow-up labs with INR on a monthly basis.  Maintain current dose of Coumadin.  Return to clinic in 6 months for follow-up.

## 2019-07-08 ENCOUNTER — Telehealth: Payer: Self-pay | Admitting: Hematology and Oncology

## 2019-07-08 NOTE — Telephone Encounter (Signed)
Scheduling per 03/26 los, patient has been called and voicemail was left.

## 2019-07-17 ENCOUNTER — Ambulatory Visit: Payer: Self-pay | Attending: Internal Medicine

## 2019-07-17 DIAGNOSIS — Z23 Encounter for immunization: Secondary | ICD-10-CM

## 2019-07-17 NOTE — Progress Notes (Signed)
   Covid-19 Vaccination Clinic  Name:  Derek Norris    MRN: [id number] DOB: [birth year]  07/17/2019  [name] was observed post Covid-19 immunization for 15 minutes without incident. He was provided with Vaccine Information Sheet and instruction to access the V-Safe system.   [name] was instructed to call 911 with any severe reactions post vaccine: Marland Kitchen Difficulty breathing  . Swelling of face and throat  . A fast heartbeat  . A bad rash all over body  . Dizziness and weakness   Immunizations Administered    Name Date Dose VIS Date Route   Pfizer COVID-19 Vaccine 07/17/2019  9:46 AM 0.3 mL 03/21/2019 Intramuscular   Manufacturer: [employer], [employer]   Lot: [id number]   NDC: [address]-0

## 2019-08-11 ENCOUNTER — Ambulatory Visit: Payer: Self-pay | Attending: Internal Medicine

## 2019-08-11 DIAGNOSIS — Z23 Encounter for immunization: Secondary | ICD-10-CM

## 2019-08-11 NOTE — Progress Notes (Signed)
   Covid-19 Vaccination Clinic  Name:  Derek Norris    MRN: [id number] DOB: 1961-01-18  08/11/2019  [name] was observed post Covid-19 immunization for 15 minutes without incident. He was provided with Vaccine Information Sheet and instruction to access the V-Safe system.   [name] was instructed to call 911 with any severe reactions post vaccine: Marland Kitchen Difficulty breathing  . Swelling of face and throat  . A fast heartbeat  . A bad rash all over body  . Dizziness and weakness   Immunizations Administered    Name Date Dose VIS Date Route   Pfizer COVID-19 Vaccine 08/11/2019  9:47 AM 0.3 mL 06/04/2018 Intramuscular   Manufacturer: [employer], [employer]   Lot: [id number]   NDC: [address]-0

## 2019-09-03 ENCOUNTER — Other Ambulatory Visit: Payer: Self-pay

## 2019-09-03 ENCOUNTER — Inpatient Hospital Stay: Payer: BC Managed Care – PPO | Attending: Hematology and Oncology

## 2019-09-03 DIAGNOSIS — I2699 Other pulmonary embolism without acute cor pulmonale: Secondary | ICD-10-CM

## 2019-09-03 DIAGNOSIS — Z86711 Personal history of pulmonary embolism: Secondary | ICD-10-CM | POA: Insufficient documentation

## 2019-09-03 DIAGNOSIS — D6861 Antiphospholipid syndrome: Secondary | ICD-10-CM

## 2019-09-03 LAB — PROTIME-INR
INR: 2.4 — ABNORMAL HIGH (ref 0.8–1.2)
Prothrombin Time: 25.3 seconds — ABNORMAL HIGH (ref 11.4–15.2)

## 2020-01-04 NOTE — Progress Notes (Signed)
Patient Care Team: Patient, No Pcp Per as PCP - General (General Practice)  DIAGNOSIS:    ICD-10-CM   1. Multiple subsegmental pulmonary emboli without acute cor pulmonale (HCC)  I26.94     CHIEF COMPLIANT: Follow-up of PEon Coumadin  INTERVAL HISTORY: Derek Norris is a 59 y.o. with above-mentioned history of pulmonary embolism currently onCoumadin.He presents to the clinic todayto review his labs.  He is tolerating Coumadin very well without any problems or concerns.  ALLERGIES:  has No Known Allergies.  MEDICATIONS:  Current Outpatient Medications  Medication Sig Dispense Refill  . albuterol (VENTOLIN HFA) 108 (90 Base) MCG/ACT inhaler Inhale 2 puffs into the lungs every 4 (four) hours as needed for wheezing or shortness of breath. (Patient not taking: Reported on 10/02/2018) 1 Inhaler 0  . benzonatate (TESSALON) 200 MG capsule Take 1 capsule (200 mg total) by mouth 3 (three) times daily. 20 capsule 0  . miconazole (MICATIN) 2 % cream Apply 1 application topically 2 (two) times daily. 28.35 g 0  . warfarin (COUMADIN) 5 MG tablet Take 1 tablet (5 mg total) by mouth daily. 30 tablet 11   No current facility-administered medications for this visit.    PHYSICAL EXAMINATION: ECOG PERFORMANCE STATUS: 1 - Symptomatic but completely ambulatory  Vitals:   01/05/20 1242  BP: (!) 156/94  Pulse: 65  Resp: 17  Temp: (!) 97.2 F (36.2 C)  SpO2: 99%   Filed Weights   01/05/20 1242  Weight: 177 lb 14.4 oz (80.7 kg)    LABORATORY DATA:  I have reviewed the data as listed CMP Latest Ref Rng & Units 09/21/2018 09/20/2018 09/19/2018  Glucose 70 - 99 mg/dL 580(D) - 983(J)  BUN 6 - 20 mg/dL 16 - 19  Creatinine 8.25 - 1.24 mg/dL 0.53 - 9.76  Sodium 734 - 145 mmol/L 139 - 139  Potassium 3.5 - 5.1 mmol/L 4.9 - 3.5  Chloride 98 - 111 mmol/L 102 - 99  CO2 22 - 32 mmol/L 28 - 26  Calcium 8.9 - 10.3 mg/dL 9.1 - 8.8(L)  Total Protein 6.5 - 8.1 g/dL 7.1 6.8 -  Total Bilirubin 0.3 - 1.2  mg/dL 0.4 0.3 -  Alkaline Phos 38 - 126 U/L 48 45 -  AST 15 - 41 U/L 17 16 -  ALT 0 - 44 U/L 15 14 -    Lab Results  Component Value Date   WBC 6.9 09/21/2018   HGB 12.6 (L) 09/21/2018   HCT 40.4 09/21/2018   MCV 94.4 09/21/2018   PLT 377 09/21/2018   NEUTROABS 6.0 09/21/2018    ASSESSMENT & PLAN:  Pulmonary embolus (HCC) COVID-19 pneumonia required hospitalization June 2020 Incidentally found acute PE and bilateral lower extremity DVT Current treatment: Xarelto  Hypercoagulability work-up: Positive for lupus anticoagulant Negative for protein C and protein S deficiencies. Factor V Leiden and prothrombin gene mutations areNegative  Confirmatory testing 03/04/2019: Consistent with lupus anticoagulant.  Recommendation: Anticoagulation for life Current treatment:Coumadin Lab monitoring: INR 2.4 on 09/03/2019, INR 2.4 on 01/05/2020 Continue follow-up labs with INR every 3 months  Maintain current dose of Coumadin.  Return to clinic in 1 year for follow-up.    No orders of the defined types were placed in this encounter.  The patient has a good understanding of the overall plan. he agrees with it. he will call with any problems that may develop before the next visit here.  Total time spent: 20 mins including face to face time and time spent for  planning, charting and coordination of care  Serena Croissant, MD 01/05/2020  Jenness Corner Dorshimer, am acting as scribe for Dr. Serena Croissant.  I have reviewed the above documentation for accuracy and completeness, and I agree with the above.

## 2020-01-05 ENCOUNTER — Inpatient Hospital Stay (HOSPITAL_BASED_OUTPATIENT_CLINIC_OR_DEPARTMENT_OTHER): Payer: BC Managed Care – PPO | Admitting: Hematology and Oncology

## 2020-01-05 ENCOUNTER — Inpatient Hospital Stay: Payer: BC Managed Care – PPO | Attending: Hematology and Oncology

## 2020-01-05 ENCOUNTER — Other Ambulatory Visit: Payer: Self-pay

## 2020-01-05 DIAGNOSIS — I2694 Multiple subsegmental pulmonary emboli without acute cor pulmonale: Secondary | ICD-10-CM | POA: Insufficient documentation

## 2020-01-05 DIAGNOSIS — D6862 Lupus anticoagulant syndrome: Secondary | ICD-10-CM | POA: Diagnosis not present

## 2020-01-05 DIAGNOSIS — Z8616 Personal history of COVID-19: Secondary | ICD-10-CM | POA: Insufficient documentation

## 2020-01-05 DIAGNOSIS — I2699 Other pulmonary embolism without acute cor pulmonale: Secondary | ICD-10-CM

## 2020-01-05 DIAGNOSIS — Z7901 Long term (current) use of anticoagulants: Secondary | ICD-10-CM | POA: Diagnosis not present

## 2020-01-05 DIAGNOSIS — D6861 Antiphospholipid syndrome: Secondary | ICD-10-CM

## 2020-01-05 DIAGNOSIS — I82403 Acute embolism and thrombosis of unspecified deep veins of lower extremity, bilateral: Secondary | ICD-10-CM | POA: Insufficient documentation

## 2020-01-05 LAB — PROTIME-INR
INR: 2.3 — ABNORMAL HIGH (ref 0.8–1.2)
Prothrombin Time: 24.3 seconds — ABNORMAL HIGH (ref 11.4–15.2)

## 2020-01-05 NOTE — Assessment & Plan Note (Signed)
COVID-19 pneumonia required hospitalization June 2020 Incidentally found acute PE and bilateral lower extremity DVT Current treatment: Xarelto  Hypercoagulability work-up: Positive for lupus anticoagulant Negative for protein C and protein S deficiencies. Factor V Leiden and prothrombin gene mutations areNegative  Confirmatory testing 03/04/2019: Consistent with lupus anticoagulant.  Recommendation: Anticoagulation for life Current treatment:Coumadin Lab monitoring: INR 2.4 on 09/03/2019 Continue follow-up labs with INR every other month  Maintain current dose of Coumadin.  Return to clinic in 1 year for follow-up.

## 2020-02-13 ENCOUNTER — Encounter: Payer: Self-pay | Admitting: *Deleted

## 2020-02-13 ENCOUNTER — Telehealth: Payer: Self-pay | Admitting: *Deleted

## 2020-02-13 NOTE — Telephone Encounter (Signed)
Received call from pt Dentist Dr. Laurann Montana- Stokes stating pt is needing to undergo a tooth extraction and requesting advise from MD on stopping warfarin.  Per MD pt to stop Warfarin 3 days prior to dental extraction and resume the day after procedure.  RN attempt x1 to contact pt with instruction. No answer, LVM to return call the the office.  RN also successfully faxed medical clearance form and instructions to Dr. Dyanne Iha at [phone]

## 2020-04-05 ENCOUNTER — Other Ambulatory Visit: Payer: Self-pay

## 2020-04-05 ENCOUNTER — Inpatient Hospital Stay: Payer: BC Managed Care – PPO | Attending: Hematology and Oncology

## 2020-04-05 DIAGNOSIS — Z86718 Personal history of other venous thrombosis and embolism: Secondary | ICD-10-CM | POA: Insufficient documentation

## 2020-04-05 DIAGNOSIS — I2694 Multiple subsegmental pulmonary emboli without acute cor pulmonale: Secondary | ICD-10-CM

## 2020-04-05 DIAGNOSIS — Z7901 Long term (current) use of anticoagulants: Secondary | ICD-10-CM | POA: Insufficient documentation

## 2020-04-05 DIAGNOSIS — Z86711 Personal history of pulmonary embolism: Secondary | ICD-10-CM | POA: Diagnosis present

## 2020-04-05 LAB — PROTIME-INR
INR: 4 — ABNORMAL HIGH (ref 0.8–1.2)
Prothrombin Time: 37.8 seconds — ABNORMAL HIGH (ref 11.4–15.2)

## 2020-04-06 ENCOUNTER — Telehealth: Payer: Self-pay | Admitting: *Deleted

## 2020-04-06 ENCOUNTER — Ambulatory Visit: Payer: BC Managed Care – PPO | Attending: Internal Medicine

## 2020-04-06 DIAGNOSIS — Z23 Encounter for immunization: Secondary | ICD-10-CM

## 2020-04-06 NOTE — Telephone Encounter (Signed)
Per Lillard Anes, NP due to pt INR being 4.0 pt needing to take Warfarin 5 mg daily x5 days a week and 2.5 mg x2 days a week.  RN placed call to pt who states understanding and will take Warfarin 5 mg Monday, Wednesday, Friday, Saturday, and Sunday and 2.5 mg on Tuesday and Thursday.   Apt schedule for labs in one month and pt verbalized understanding.

## 2020-04-06 NOTE — Progress Notes (Signed)
   Covid-19 Vaccination Clinic  Name:  Sang Blount    MRN: [id number] DOB: February 16, 1961  04/06/2020  Mr. Muns was observed post Covid-19 immunization for 15 minutes without incident. He was provided with Vaccine Information Sheet and instruction to access the V-Safe system.   Mr. Gurganus was instructed to call 911 with any severe reactions post vaccine: Marland Kitchen Difficulty breathing  . Swelling of face and throat  . A fast heartbeat  . A bad rash all over body  . Dizziness and weakness   Immunizations Administered    Name Date Dose VIS Date Route   Pfizer COVID-19 Vaccine 04/06/2020  1:47 PM 0.3 mL 01/28/2020 Intramuscular   Manufacturer: [employer], [employer]   Lot: [id number]   NDC: [address]-9

## 2020-04-06 NOTE — Telephone Encounter (Signed)
Pt INR 4.0.  RN attempt x2 to contact pt regarding lab results and to confirm the dosage of Warfarin pt is taking.  No answer, LVM to return call to the office.

## 2020-04-12 ENCOUNTER — Other Ambulatory Visit: Payer: Self-pay | Admitting: Hematology and Oncology

## 2020-04-13 ENCOUNTER — Other Ambulatory Visit: Payer: Self-pay

## 2020-04-13 MED ORDER — WARFARIN SODIUM 5 MG PO TABS: 5.0000 mg | ORAL_TABLET | Freq: Every day | ORAL | 0 refills | Status: DC

## 2020-05-07 ENCOUNTER — Inpatient Hospital Stay: Payer: BC Managed Care – PPO | Attending: Hematology and Oncology

## 2020-05-07 DIAGNOSIS — Z86711 Personal history of pulmonary embolism: Secondary | ICD-10-CM | POA: Insufficient documentation

## 2020-05-10 ENCOUNTER — Other Ambulatory Visit: Payer: Self-pay

## 2020-05-10 ENCOUNTER — Telehealth: Payer: Self-pay | Admitting: Hematology and Oncology

## 2020-05-10 ENCOUNTER — Inpatient Hospital Stay: Payer: BC Managed Care – PPO

## 2020-05-10 DIAGNOSIS — Z86711 Personal history of pulmonary embolism: Secondary | ICD-10-CM | POA: Diagnosis present

## 2020-05-10 DIAGNOSIS — I2694 Multiple subsegmental pulmonary emboli without acute cor pulmonale: Secondary | ICD-10-CM

## 2020-05-10 LAB — PROTIME-INR
INR: 2.5 — ABNORMAL HIGH (ref 0.8–1.2)
Prothrombin Time: 26 seconds — ABNORMAL HIGH (ref 11.4–15.2)

## 2020-05-10 NOTE — Telephone Encounter (Signed)
I left a voicemail with the patient that his INR is 2.5 Continue with the same dosage of Coumadin.

## 2020-07-05 ENCOUNTER — Inpatient Hospital Stay: Payer: BC Managed Care – PPO | Attending: Hematology and Oncology

## 2020-10-05 ENCOUNTER — Inpatient Hospital Stay: Payer: BC Managed Care – PPO | Attending: Hematology and Oncology

## 2020-10-05 ENCOUNTER — Other Ambulatory Visit: Payer: Self-pay

## 2020-10-05 DIAGNOSIS — Z86711 Personal history of pulmonary embolism: Secondary | ICD-10-CM | POA: Diagnosis present

## 2020-10-05 DIAGNOSIS — I2694 Multiple subsegmental pulmonary emboli without acute cor pulmonale: Secondary | ICD-10-CM

## 2020-10-05 LAB — PROTIME-INR
INR: 2.3 — ABNORMAL HIGH (ref 0.8–1.2)
Prothrombin Time: 24.9 seconds — ABNORMAL HIGH (ref 11.4–15.2)

## 2020-10-25 ENCOUNTER — Other Ambulatory Visit: Payer: Self-pay | Admitting: Hematology and Oncology

## 2021-01-04 NOTE — Progress Notes (Signed)
Patient Care Team: Patient, No Pcp Per (Inactive) as PCP - General (General Practice)  DIAGNOSIS:    ICD-10-CM   1. Other acute pulmonary embolism without acute cor pulmonale (HCC)  I26.99       CHIEF COMPLIANT: Follow-up of PE on Coumadin  INTERVAL HISTORY: Derek Norris is a 60 y.o. with above-mentioned history of pulmonary embolism currently on Coumadin. He presents to the clinic today to review his labs.  He does not report any problems taking Coumadin.  Denies any bleeding symptoms.  His INR has been in excellent range.  ALLERGIES:  has No Known Allergies.  MEDICATIONS:  Current Outpatient Medications  Medication Sig Dispense Refill   albuterol (VENTOLIN HFA) 108 (90 Base) MCG/ACT inhaler Inhale 2 puffs into the lungs every 4 (four) hours as needed for wheezing or shortness of breath. (Patient not taking: Reported on 10/02/2018) 1 Inhaler 0   benzonatate (TESSALON) 200 MG capsule Take 1 capsule (200 mg total) by mouth 3 (three) times daily. 20 capsule 0   miconazole (MICATIN) 2 % cream Apply 1 application topically 2 (two) times daily. 28.35 g 0   warfarin (COUMADIN) 5 MG tablet TAKE 1 TABLET BY MOUTH EVERY DAY 90 tablet 0   No current facility-administered medications for this visit.    PHYSICAL EXAMINATION: ECOG PERFORMANCE STATUS: 1 - Symptomatic but completely ambulatory  Vitals:   01/05/21 1055  Pulse: 61  Resp: 18  Temp: 97.6 F (36.4 C)  SpO2: 100%   Filed Weights   01/05/21 1055  Weight: 172 lb (78 kg)    LABORATORY DATA:  I have reviewed the data as listed CMP Latest Ref Rng & Units 09/21/2018 09/20/2018 09/19/2018  Glucose 70 - 99 mg/dL 161(W) - 960(A)  BUN 6 - 20 mg/dL 16 - 19  Creatinine 5.40 - 1.24 mg/dL 9.81 - 1.91  Sodium 478 - 145 mmol/L 139 - 139  Potassium 3.5 - 5.1 mmol/L 4.9 - 3.5  Chloride 98 - 111 mmol/L 102 - 99  CO2 22 - 32 mmol/L 28 - 26  Calcium 8.9 - 10.3 mg/dL 9.1 - 8.8(L)  Total Protein 6.5 - 8.1 g/dL 7.1 6.8 -  Total Bilirubin  0.3 - 1.2 mg/dL 0.4 0.3 -  Alkaline Phos 38 - 126 U/L 48 45 -  AST 15 - 41 U/L 17 16 -  ALT 0 - 44 U/L 15 14 -    Lab Results  Component Value Date   WBC 6.9 09/21/2018   HGB 12.6 (L) 09/21/2018   HCT 40.4 09/21/2018   MCV 94.4 09/21/2018   PLT 377 09/21/2018   NEUTROABS 6.0 09/21/2018    ASSESSMENT & PLAN:  Pulmonary embolism (HCC) COVID-19 pneumonia required hospitalization June 2020 Incidentally found acute PE and bilateral lower extremity DVT   Hypercoagulability work-up: Positive for lupus anticoagulant Negative for protein C and protein S deficiencies. Factor V Leiden and prothrombin gene mutations are Negative   Confirmatory testing 03/04/2019: Consistent with lupus anticoagulant.   Recommendation: Anticoagulation for life Current treatment: Coumadin Lab monitoring: INR 2.3 on 10/05/20 Continue follow-up labs with INR every 3 months   Maintain current dose of Coumadin. He works at [employer] and stays very busy.  Return to clinic in 1 year for follow-up.    No orders of the defined types were placed in this encounter.  The patient has a good understanding of the overall plan. he agrees with it. he will call with any problems that may develop before the next visit here.  Total time spent: 20 mins including face to face time and time spent for planning, charting and coordination of care  Sabas Sous, MD, MPH 01/05/2021  I, Alda Ponder, am acting as scribe for Dr. Serena Croissant.  I have reviewed the above documentation for accuracy and completeness, and I agree with the above.

## 2021-01-05 ENCOUNTER — Inpatient Hospital Stay: Payer: BC Managed Care – PPO | Attending: Hematology and Oncology | Admitting: Hematology and Oncology

## 2021-01-05 ENCOUNTER — Inpatient Hospital Stay: Payer: BC Managed Care – PPO

## 2021-01-05 ENCOUNTER — Other Ambulatory Visit: Payer: Self-pay

## 2021-01-05 ENCOUNTER — Other Ambulatory Visit: Payer: Self-pay | Admitting: *Deleted

## 2021-01-05 DIAGNOSIS — Z79899 Other long term (current) drug therapy: Secondary | ICD-10-CM | POA: Insufficient documentation

## 2021-01-05 DIAGNOSIS — Z86718 Personal history of other venous thrombosis and embolism: Secondary | ICD-10-CM | POA: Diagnosis present

## 2021-01-05 DIAGNOSIS — Z7901 Long term (current) use of anticoagulants: Secondary | ICD-10-CM | POA: Insufficient documentation

## 2021-01-05 DIAGNOSIS — D6862 Lupus anticoagulant syndrome: Secondary | ICD-10-CM | POA: Diagnosis not present

## 2021-01-05 DIAGNOSIS — I2694 Multiple subsegmental pulmonary emboli without acute cor pulmonale: Secondary | ICD-10-CM

## 2021-01-05 DIAGNOSIS — I2699 Other pulmonary embolism without acute cor pulmonale: Secondary | ICD-10-CM | POA: Diagnosis not present

## 2021-01-05 DIAGNOSIS — Z86711 Personal history of pulmonary embolism: Secondary | ICD-10-CM | POA: Diagnosis present

## 2021-01-05 LAB — PROTIME-INR
INR: 2 — ABNORMAL HIGH (ref 0.8–1.2)
Prothrombin Time: 22.8 seconds — ABNORMAL HIGH (ref 11.4–15.2)

## 2021-01-05 MED ORDER — WARFARIN SODIUM 5 MG PO TABS: 5.0000 mg | ORAL_TABLET | Freq: Every day | ORAL | 3 refills | Status: DC

## 2021-01-05 NOTE — Assessment & Plan Note (Signed)
COVID-19 pneumonia required hospitalization June 2020 Incidentally found acute PE and bilateral lower extremity DVT  Hypercoagulability work-up: Positive for lupus anticoagulant Negative for protein C and protein S deficiencies. Factor V Leiden and prothrombin gene mutations areNegative  Confirmatory testing 03/04/2019: Consistent with lupus anticoagulant.  Recommendation: Anticoagulation for life Current treatment:Coumadin Lab monitoring: INR 2.3 on 10/05/20 Continue follow-up labs with INRevery 3 months  Maintain current dose of Coumadin.  Return to clinic in 1 year for follow-up.

## 2021-04-08 ENCOUNTER — Other Ambulatory Visit: Payer: Self-pay

## 2021-04-08 DIAGNOSIS — I2694 Multiple subsegmental pulmonary emboli without acute cor pulmonale: Secondary | ICD-10-CM

## 2021-04-12 ENCOUNTER — Other Ambulatory Visit: Payer: Self-pay

## 2021-04-12 ENCOUNTER — Inpatient Hospital Stay: Payer: BC Managed Care – PPO | Attending: Hematology and Oncology

## 2021-04-12 ENCOUNTER — Inpatient Hospital Stay (HOSPITAL_BASED_OUTPATIENT_CLINIC_OR_DEPARTMENT_OTHER): Payer: BC Managed Care – PPO | Admitting: Hematology and Oncology

## 2021-04-12 ENCOUNTER — Inpatient Hospital Stay (HOSPITAL_BASED_OUTPATIENT_CLINIC_OR_DEPARTMENT_OTHER): Payer: BC Managed Care – PPO | Admitting: Physician Assistant

## 2021-04-12 DIAGNOSIS — Z79899 Other long term (current) drug therapy: Secondary | ICD-10-CM | POA: Diagnosis not present

## 2021-04-12 DIAGNOSIS — Z8616 Personal history of COVID-19: Secondary | ICD-10-CM | POA: Insufficient documentation

## 2021-04-12 DIAGNOSIS — T59811A Toxic effect of smoke, accidental (unintentional), initial encounter: Secondary | ICD-10-CM | POA: Insufficient documentation

## 2021-04-12 DIAGNOSIS — D6862 Lupus anticoagulant syndrome: Secondary | ICD-10-CM | POA: Diagnosis not present

## 2021-04-12 DIAGNOSIS — I2694 Multiple subsegmental pulmonary emboli without acute cor pulmonale: Secondary | ICD-10-CM | POA: Diagnosis not present

## 2021-04-12 DIAGNOSIS — Z7901 Long term (current) use of anticoagulants: Secondary | ICD-10-CM | POA: Insufficient documentation

## 2021-04-12 DIAGNOSIS — Z86711 Personal history of pulmonary embolism: Secondary | ICD-10-CM | POA: Diagnosis present

## 2021-04-12 DIAGNOSIS — Z86718 Personal history of other venous thrombosis and embolism: Secondary | ICD-10-CM | POA: Diagnosis present

## 2021-04-12 LAB — PROTIME-INR
INR: 2.5 — ABNORMAL HIGH (ref 0.8–1.2)
Prothrombin Time: 26.7 seconds — ABNORMAL HIGH (ref 11.4–15.2)
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
## 2021-04-12 NOTE — Assessment & Plan Note (Signed)
COVID-19 pneumonia required hospitalization June 2020 Incidentally found acute PE and bilateral lower extremity DVT Current treatment: Xarelto  Hypercoagulability work-up: Positive for lupus anticoagulant Negative for protein C and protein S deficiencies. Factor V Leiden and prothrombin gene mutations areNegative  Confirmatory testing 03/04/2019: Consistent with lupus anticoagulant.  Recommendation: Anticoagulation for life Current treatment:Coumadin

## 2021-04-13 NOTE — Progress Notes (Signed)
Note opened in error. Patient seen by a different provider

## 2021-07-04 ENCOUNTER — Other Ambulatory Visit: Payer: Self-pay | Admitting: *Deleted

## 2021-07-04 DIAGNOSIS — I2699 Other pulmonary embolism without acute cor pulmonale: Secondary | ICD-10-CM

## 2021-07-06 ENCOUNTER — Inpatient Hospital Stay: Payer: BC Managed Care – PPO | Attending: Hematology and Oncology

## 2021-07-06 ENCOUNTER — Other Ambulatory Visit: Payer: Self-pay

## 2021-07-06 DIAGNOSIS — Z86718 Personal history of other venous thrombosis and embolism: Secondary | ICD-10-CM | POA: Diagnosis present

## 2021-07-06 DIAGNOSIS — I2699 Other pulmonary embolism without acute cor pulmonale: Secondary | ICD-10-CM

## 2021-07-06 DIAGNOSIS — Z86711 Personal history of pulmonary embolism: Secondary | ICD-10-CM | POA: Diagnosis not present

## 2021-07-06 LAB — PROTIME-INR
INR: 2 — ABNORMAL HIGH (ref 0.8–1.2)
Prothrombin Time: 22.7 seconds — ABNORMAL HIGH (ref 11.4–15.2)

## 2021-10-06 ENCOUNTER — Other Ambulatory Visit: Payer: Self-pay

## 2021-10-06 ENCOUNTER — Inpatient Hospital Stay: Payer: BC Managed Care – PPO | Attending: Hematology and Oncology

## 2021-10-06 DIAGNOSIS — I2699 Other pulmonary embolism without acute cor pulmonale: Secondary | ICD-10-CM | POA: Diagnosis present

## 2021-10-06 DIAGNOSIS — Z86711 Personal history of pulmonary embolism: Secondary | ICD-10-CM | POA: Insufficient documentation

## 2021-10-06 DIAGNOSIS — Z7901 Long term (current) use of anticoagulants: Secondary | ICD-10-CM | POA: Diagnosis not present

## 2021-10-06 LAB — PROTIME-INR
INR: 2.8 — ABNORMAL HIGH (ref 0.8–1.2)
Prothrombin Time: 29.3 seconds — ABNORMAL HIGH (ref 11.4–15.2)

## 2022-01-06 ENCOUNTER — Inpatient Hospital Stay: Payer: BC Managed Care – PPO | Admitting: Hematology and Oncology

## 2022-01-06 ENCOUNTER — Inpatient Hospital Stay: Payer: BC Managed Care – PPO | Attending: Hematology and Oncology

## 2022-01-06 NOTE — Assessment & Plan Note (Deleted)
COVID-19 pneumonia required hospitalization June 2020 Incidentally found acute PE and bilateral lower extremity DVT Current treatment: Xarelto  Hypercoagulability work-up: Positive for lupus anticoagulant Negative for protein C and protein S deficiencies. Factor V Leiden and prothrombin gene mutations areNegative  Confirmatory testing 03/04/2019: Consistent with lupus anticoagulant.  Recommendation: Anticoagulation for life Current treatment:Coumadin INR today is 2.5  RTC in 1 year
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
## 2022-01-24 ENCOUNTER — Inpatient Hospital Stay (HOSPITAL_BASED_OUTPATIENT_CLINIC_OR_DEPARTMENT_OTHER): Payer: BC Managed Care – PPO | Admitting: Hematology and Oncology

## 2022-01-24 ENCOUNTER — Inpatient Hospital Stay: Payer: BC Managed Care – PPO | Attending: Hematology and Oncology

## 2022-01-24 ENCOUNTER — Other Ambulatory Visit: Payer: Self-pay

## 2022-01-24 DIAGNOSIS — Z7901 Long term (current) use of anticoagulants: Secondary | ICD-10-CM | POA: Diagnosis not present

## 2022-01-24 DIAGNOSIS — D6862 Lupus anticoagulant syndrome: Secondary | ICD-10-CM | POA: Insufficient documentation

## 2022-01-24 DIAGNOSIS — I2694 Multiple subsegmental pulmonary emboli without acute cor pulmonale: Secondary | ICD-10-CM

## 2022-01-24 DIAGNOSIS — I2699 Other pulmonary embolism without acute cor pulmonale: Secondary | ICD-10-CM

## 2022-01-24 LAB — PROTIME-INR
INR: 1.8 — ABNORMAL HIGH (ref 0.8–1.2)
Prothrombin Time: 20.2 seconds — ABNORMAL HIGH (ref 11.4–15.2)

## 2022-01-24 NOTE — Assessment & Plan Note (Addendum)
COVID-19 pneumonia required hospitalization June 2020 Incidentally found acute PE and bilateral lower extremity DVT Current treatment: Xarelto  Hypercoagulability work-up: Positive for lupus anticoagulant Negative for protein C and protein S deficiencies. Factor V Leiden and prothrombin gene mutations areNegative  Confirmatory testing 03/04/2019: Consistent with lupus anticoagulant.  Recommendation: Anticoagulation for life Current treatment:Coumadin INR today is 1.8 (keeping the dose the same)  INR checks every 3 months Return to clinic in 1 year for follow-up

## 2022-03-25 ENCOUNTER — Other Ambulatory Visit: Payer: Self-pay | Admitting: Hematology and Oncology

## 2022-04-26 ENCOUNTER — Inpatient Hospital Stay: Payer: BC Managed Care – PPO | Attending: Hematology and Oncology

## 2022-04-26 ENCOUNTER — Inpatient Hospital Stay: Payer: BC Managed Care – PPO

## 2022-04-27 ENCOUNTER — Inpatient Hospital Stay: Payer: BC Managed Care – PPO | Attending: Hematology and Oncology

## 2022-04-27 DIAGNOSIS — Z7901 Long term (current) use of anticoagulants: Secondary | ICD-10-CM | POA: Diagnosis not present

## 2022-04-27 DIAGNOSIS — Z86711 Personal history of pulmonary embolism: Secondary | ICD-10-CM | POA: Insufficient documentation

## 2022-04-27 DIAGNOSIS — I2699 Other pulmonary embolism without acute cor pulmonale: Secondary | ICD-10-CM

## 2022-04-27 LAB — PROTIME-INR
INR: 2.4 — ABNORMAL HIGH (ref 0.8–1.2)
Prothrombin Time: 26.2 seconds — ABNORMAL HIGH (ref 11.4–15.2)

## 2022-07-25 ENCOUNTER — Other Ambulatory Visit: Payer: Self-pay | Admitting: *Deleted

## 2022-07-25 DIAGNOSIS — I2699 Other pulmonary embolism without acute cor pulmonale: Secondary | ICD-10-CM

## 2022-07-26 ENCOUNTER — Inpatient Hospital Stay: Payer: BC Managed Care – PPO | Attending: Hematology and Oncology

## 2022-10-25 ENCOUNTER — Inpatient Hospital Stay: Payer: BC Managed Care – PPO | Attending: Hematology and Oncology

## 2023-01-16 ENCOUNTER — Telehealth: Payer: Self-pay | Admitting: Hematology and Oncology

## 2023-01-16 NOTE — Telephone Encounter (Signed)
01/16/23; Due to a change in the provider schedule; I called the patient and left a voice mail with the rescheduled appointment date and time. I mailed a reminder notice with the new appointment details.

## 2023-01-25 ENCOUNTER — Inpatient Hospital Stay: Payer: BC Managed Care – PPO | Admitting: Hematology and Oncology

## 2023-01-25 ENCOUNTER — Inpatient Hospital Stay: Payer: BC Managed Care – PPO

## 2023-01-27 ENCOUNTER — Emergency Department (HOSPITAL_COMMUNITY): Payer: BC Managed Care – PPO

## 2023-01-27 ENCOUNTER — Emergency Department (HOSPITAL_COMMUNITY)
Admission: EM | Admit: 2023-01-27 | Discharge: 2023-01-27 | Disposition: A | Discharge status: Home / Self Care | Payer: BC Managed Care – PPO | Attending: Emergency Medicine | Admitting: Emergency Medicine

## 2023-01-27 ENCOUNTER — Other Ambulatory Visit: Payer: Self-pay

## 2023-01-27 ENCOUNTER — Encounter (HOSPITAL_COMMUNITY): Payer: Self-pay | Admitting: Emergency Medicine

## 2023-01-27 DIAGNOSIS — Z7901 Long term (current) use of anticoagulants: Secondary | ICD-10-CM | POA: Diagnosis not present

## 2023-01-27 DIAGNOSIS — R55 Syncope and collapse: Secondary | ICD-10-CM | POA: Insufficient documentation

## 2023-01-27 LAB — ETHANOL: Alcohol, Ethyl (B): 18 mg/dL — ABNORMAL HIGH (ref <10)

## 2023-01-27 LAB — COMPREHENSIVE METABOLIC PANEL
ALT: 15 U/L (ref 0–44)
AST: 17 U/L (ref 15–41)
Albumin: 4.2 g/dL (ref 3.5–5.0)
Alkaline Phosphatase: 52 U/L (ref 38–126)
Anion gap: 13 (ref 5–15)
BUN: 18 mg/dL (ref 8–23)
CO2: 23 mmol/L (ref 22–32)
Calcium: 8.7 mg/dL — ABNORMAL LOW (ref 8.9–10.3)
Chloride: 97 mmol/L — ABNORMAL LOW (ref 98–111)
Creatinine, Ser: 1.18 mg/dL (ref 0.61–1.24)
GFR, Estimated: >60 mL/min (ref >60)
Glucose, Bld: 126 mg/dL — ABNORMAL HIGH (ref 70–99)
Potassium: 3.2 mmol/L — ABNORMAL LOW (ref 3.5–5.1)
Sodium: 133 mmol/L — ABNORMAL LOW (ref 135–145)
Total Bilirubin: 0.9 mg/dL (ref 0.3–1.2)
Total Protein: 6.9 g/dL (ref 6.5–8.1)

## 2023-01-27 LAB — CBC WITH DIFFERENTIAL/PLATELET
Abs Immature Granulocytes: 0.01 10*3/uL (ref 0.00–0.07)
Basophils Absolute: 0 10*3/uL (ref 0.0–0.1)
Basophils Relative: 1 %
Eosinophils Absolute: 0.1 10*3/uL (ref 0.0–0.5)
Eosinophils Relative: 1 %
HCT: 43.8 % (ref 39.0–52.0)
Hemoglobin: 14.5 g/dL (ref 13.0–17.0)
Immature Granulocytes: 0 %
Lymphocytes Relative: 24 %
Lymphs Abs: 1.3 10*3/uL (ref 0.7–4.0)
MCH: 30.9 pg (ref 26.0–34.0)
MCHC: 33.1 g/dL (ref 30.0–36.0)
MCV: 93.4 fL (ref 80.0–100.0)
Monocytes Absolute: 0.5 10*3/uL (ref 0.1–1.0)
Monocytes Relative: 9 %
Neutro Abs: 3.4 10*3/uL (ref 1.7–7.7)
Neutrophils Relative %: 65 %
Platelets: 187 10*3/uL (ref 150–400)
RBC: 4.69 MIL/uL (ref 4.22–5.81)
RDW: 13.3 % (ref 11.5–15.5)
WBC: 5.3 10*3/uL (ref 4.0–10.5)
nRBC: 0 % (ref 0.0–0.2)

## 2023-01-27 LAB — TROPONIN I (HIGH SENSITIVITY): Troponin I (High Sensitivity): 6 ng/L (ref <18)

## 2023-01-27 MED ORDER — SODIUM CHLORIDE 0.9 % IV BOLUS
1000.0000 mL | Freq: Once | INTRAVENOUS | Status: AC
  Administered 2023-01-27: 1000 mL via INTRAVENOUS

## 2023-01-27 MED ORDER — POTASSIUM CHLORIDE CRYS ER 20 MEQ PO TBCR
40.0000 meq | EXTENDED_RELEASE_TABLET | Freq: Once | ORAL | Status: AC
  Administered 2023-01-27: 40 meq via ORAL
  Filled 2023-01-27: qty 2

## 2023-01-27 NOTE — ED Provider Notes (Incomplete Revision)
[place] EMERGENCY DEPARTMENT AT [facility] Provider Note   CSN: [id number] Arrival date & time: 01/27/23  1728     History  No chief complaint on file.   Derek Norris is a 62 y.o. male.  Patient was at a football game and passed out.  Patient has no complaints right now  The history is provided by the patient and medical records. No language interpreter was used.  Loss of Consciousness Episode history:  Single Most recent episode:  Today Timing:  Unable to specify Progression:  Resolved Chronicity:  New Context: not blood draw   Witnessed: no   Relieved by:  Nothing Worsened by:  Nothing Ineffective treatments:  None tried Associated symptoms: no anxiety, no chest pain, no headaches and no seizures   Risk factors: no congenital heart disease        Home Medications Prior to Admission medications   Medication Sig Start Date End Date Taking? Authorizing Provider  warfarin (COUMADIN) 5 MG tablet TAKE 1 TABLET (5 MG TOTAL) BY MOUTH DAILY. 03/27/22   Serena Croissant, MD      Allergies    Patient has no known allergies.    Review of Systems   Review of Systems  Constitutional:  Negative for appetite change and fatigue.  HENT:  Negative for congestion, ear discharge and sinus pressure.   Eyes:  Negative for discharge.  Respiratory:  Negative for cough.   Cardiovascular:  Positive for syncope. Negative for chest pain.  Gastrointestinal:  Negative for abdominal pain and diarrhea.  Genitourinary:  Negative for frequency and hematuria.  Musculoskeletal:  Negative for back pain.  Skin:  Negative for rash.  Neurological:  Positive for syncope. Negative for seizures and headaches.  Psychiatric/Behavioral:  Negative for hallucinations.     Physical Exam Updated Vital Signs There were no vitals taken for this visit. Physical Exam Vitals and nursing note reviewed.  Constitutional:      Appearance: He is well-developed.  HENT:     Head: Normocephalic.      Mouth/Throat:     Mouth: Mucous membranes are moist.  Eyes:     General: No scleral icterus.    Conjunctiva/sclera: Conjunctivae normal.  Neck:     Thyroid: No thyromegaly.  Cardiovascular:     Rate and Rhythm: Normal rate and regular rhythm.     Heart sounds: No murmur heard.    No friction rub. No gallop.  Pulmonary:     Breath sounds: No stridor. No wheezing or rales.  Chest:     Chest wall: No tenderness.  Abdominal:     General: There is no distension.     Tenderness: There is no abdominal tenderness. There is no rebound.  Musculoskeletal:        General: Normal range of motion.     Cervical back: Neck supple.  Lymphadenopathy:     Cervical: No cervical adenopathy.  Skin:    Findings: No erythema or rash.  Neurological:     Mental Status: He is alert and oriented to person, place, and time.     Motor: No abnormal muscle tone.     Coordination: Coordination normal.  Psychiatric:        Behavior: Behavior normal.     ED Results / Procedures / Treatments   Labs (all labs ordered are listed, but only abnormal results are displayed) Labs Reviewed - No data to display  EKG None  Radiology No results found.  Procedures Procedures    Medications Ordered  in ED Medications - No data to display  ED Course/ Medical Decision Making/ A&P                                 Medical Decision Making Amount and/or Complexity of Data Reviewed Labs: ordered. Radiology: ordered. ECG/medicine tests: ordered.  Risk Prescription drug management.   Patient with syncope.  Patient decided to leave AMA he did not want any test done.  He was alert and oriented x 4   Patient changed his mind decided to be seen.  Labs and x-ray and EKG are unremarkable.  Patient was given some IV fluids and did well.  Patient had syncope secondary to dehydration and alcohol use and will follow-up with PCP     Final Clinical Impression(s) / ED Diagnoses Final diagnoses:  Syncope,  unspecified syncope type    Rx / DC Orders ED Discharge Orders     None         Bethann Berkshire, MD 01/27/23 1747

## 2023-01-27 NOTE — ED Provider Notes (Signed)
[place] EMERGENCY DEPARTMENT AT [facility] Provider Note   CSN: [id number] Arrival date & time: 01/27/23  1728     History  No chief complaint on file.   Derek Norris is a 62 y.o. male.  Patient was at a football game and passed out.  Patient has no complaints right now  The history is provided by the patient and medical records. No language interpreter was used.  Loss of Consciousness Episode history:  Single Most recent episode:  Today Timing:  Unable to specify Progression:  Resolved Chronicity:  New Context: not blood draw   Witnessed: no   Relieved by:  Nothing Worsened by:  Nothing Ineffective treatments:  None tried Associated symptoms: no anxiety, no chest pain, no headaches and no seizures   Risk factors: no congenital heart disease        Home Medications Prior to Admission medications   Medication Sig Start Date End Date Taking? Authorizing Provider  warfarin (COUMADIN) 5 MG tablet TAKE 1 TABLET (5 MG TOTAL) BY MOUTH DAILY. 03/27/22   Serena Croissant, MD      Allergies    Patient has no known allergies.    Review of Systems   Review of Systems  Constitutional:  Negative for appetite change and fatigue.  HENT:  Negative for congestion, ear discharge and sinus pressure.   Eyes:  Negative for discharge.  Respiratory:  Negative for cough.   Cardiovascular:  Positive for syncope. Negative for chest pain.  Gastrointestinal:  Negative for abdominal pain and diarrhea.  Genitourinary:  Negative for frequency and hematuria.  Musculoskeletal:  Negative for back pain.  Skin:  Negative for rash.  Neurological:  Positive for syncope. Negative for seizures and headaches.  Psychiatric/Behavioral:  Negative for hallucinations.     Physical Exam Updated Vital Signs There were no vitals taken for this visit. Physical Exam Vitals and nursing note reviewed.  Constitutional:      Appearance: He is well-developed.  HENT:     Head: Normocephalic.      Mouth/Throat:     Mouth: Mucous membranes are moist.  Eyes:     General: No scleral icterus.    Conjunctiva/sclera: Conjunctivae normal.  Neck:     Thyroid: No thyromegaly.  Cardiovascular:     Rate and Rhythm: Normal rate and regular rhythm.     Heart sounds: No murmur heard.    No friction rub. No gallop.  Pulmonary:     Breath sounds: No stridor. No wheezing or rales.  Chest:     Chest wall: No tenderness.  Abdominal:     General: There is no distension.     Tenderness: There is no abdominal tenderness. There is no rebound.  Musculoskeletal:        General: Normal range of motion.     Cervical back: Neck supple.  Lymphadenopathy:     Cervical: No cervical adenopathy.  Skin:    Findings: No erythema or rash.  Neurological:     Mental Status: He is alert and oriented to person, place, and time.     Motor: No abnormal muscle tone.     Coordination: Coordination normal.  Psychiatric:        Behavior: Behavior normal.     ED Results / Procedures / Treatments   Labs (all labs ordered are listed, but only abnormal results are displayed) Labs Reviewed - No data to display  EKG None  Radiology No results found.  Procedures Procedures    Medications Ordered  in ED Medications - No data to display  ED Course/ Medical Decision Making/ A&P                                 Medical Decision Making Amount and/or Complexity of Data Reviewed Labs: ordered. Radiology: ordered. ECG/medicine tests: ordered.  Risk Prescription drug management.   Patient with syncope.  Patient decided to leave AMA he did not want any test done.  He was alert and oriented x 4 After patient decided to leave AMA he changed his mind and was seen in the emergency department.     This patient presents to the ED for concern of syncope, this involves an extensive number of treatment options, and is a complaint that carries with it a high risk of complications and morbidity.  The  differential diagnosis includes dehydration, MI   Co morbidities that complicate the patient evaluation  EtOH use   Additional history obtained:  Additional history obtained from patient External records from outside source obtained and reviewed including hospital records   Lab Tests:  I Ordered, and personally interpreted labs.  The pertinent results include: Potassium 3.2   Imaging Studies ordered:  I ordered imaging studies including chest x-ray I independently visualized and interpreted imaging which showed negative I agree with the radiologist interpretation   Cardiac Monitoring: / EKG:  The patient was maintained on a cardiac monitor.  I personally viewed and interpreted the cardiac monitored which showed an underlying rhythm of: Normal sinus rhythm   Consultations Obtained:  No consultant Problem List / ED Course / Critical interventions / Medication management  Syncope I ordered medication including normal saline for dehydration Reevaluation of the patient after these medicines showed that the patient improved I have reviewed the patients home medicines and have made adjustments as needed   Social Determinants of Health:  None   Test / Admission - Considered:  None.  Patient changed his mind decided to be seen.  Labs and x-ray and EKG are unremarkable.  Patient was given some IV fluids and did well.  Patient had syncope secondary to dehydration and alcohol use and will follow-up with PCP     Final Clinical Impression(s) / ED Diagnoses Final diagnoses:  Syncope, unspecified syncope type    Rx / DC Orders ED Discharge Orders     None         Bethann Berkshire, MD 01/27/23 Karren Burly    Bethann Berkshire, MD 01/29/23 1121

## 2023-01-27 NOTE — Discharge Instructions (Signed)
Drink plenty of fluids and follow-up with [facility]

## 2023-01-27 NOTE — ED Triage Notes (Signed)
Pt BIB EMS from football game after syncopal episode. Per friends, pt was sitting down and passed out in chair. Pt does take coumadin for hx of PE. SBP in 60's on arrival.  BP 110/60 P 75 SpO2 100% CBG 116

## 2023-01-27 NOTE — ED Notes (Signed)
Pt very adamant on leaving AMA but agreeable to lab work. When approached to get vital signs, pt refused. Stating, "I don't need any of that. How long will all this take."

## 2023-03-02 ENCOUNTER — Inpatient Hospital Stay: Payer: BC Managed Care – PPO | Attending: Hematology and Oncology | Admitting: Hematology and Oncology

## 2023-03-02 ENCOUNTER — Telehealth: Payer: Self-pay | Admitting: *Deleted

## 2023-03-02 ENCOUNTER — Inpatient Hospital Stay: Payer: BC Managed Care – PPO

## 2023-03-02 VITALS — BP 158/88 | HR 66 | Temp 97.8°F | Resp 18 | Ht 79.0 in | Wt 163.3 lb

## 2023-03-02 DIAGNOSIS — I2699 Other pulmonary embolism without acute cor pulmonale: Secondary | ICD-10-CM | POA: Insufficient documentation

## 2023-03-02 DIAGNOSIS — Z7901 Long term (current) use of anticoagulants: Secondary | ICD-10-CM | POA: Diagnosis not present

## 2023-03-02 DIAGNOSIS — D6862 Lupus anticoagulant syndrome: Secondary | ICD-10-CM | POA: Diagnosis present

## 2023-03-02 DIAGNOSIS — Z8616 Personal history of COVID-19: Secondary | ICD-10-CM | POA: Diagnosis not present

## 2023-03-02 LAB — PROTIME-INR
INR: 1.7 — ABNORMAL HIGH (ref 0.8–1.2)
Prothrombin Time: 20 s — ABNORMAL HIGH (ref 11.4–15.2)

## 2023-03-02 MED ORDER — WARFARIN SODIUM 5 MG PO TABS: 5.0000 mg | ORAL_TABLET | Freq: Every day | ORAL | 3 refills | Status: DC

## 2023-03-02 NOTE — Telephone Encounter (Signed)
Pt INR 1.7.  Per MD pt to take Warfarin 5 mg M-F and 7.5 mg Saturday and Sunday.  Pt educated to establish care with PCP for repeat lab draw in 3 months due to the out of pocket cost associated with specialists visit. RN attempt x1 to contact pt, no answer, LVM.

## 2023-03-02 NOTE — Progress Notes (Signed)
Patient Care Team: Patient, No Pcp Per as PCP - General (General Practice)  DIAGNOSIS:  Encounter Diagnosis  Name Primary?   Other acute pulmonary embolism without acute cor pulmonale (HCC) Yes     CHIEF COMPLIANT: Follow-up of lupus anticoagulant on warfarin therapy  HISTORY OF PRESENT ILLNESS:   History of Present Illness   The patient, with a history of lupus anticoagulant on life long anticoagulation therapy, presents for a routine follow-up. He reports adherence to his medication regimen, specifically his blood thinners, and has not experienced any issues. However, he expresses a need for a primary care physician since his previous one passed away. He has been managing his health independently since then. The patient also mentions the financial burden of his current healthcare management, specifically the cost of regular blood work, which he believes is too high.         ALLERGIES:  has No Known Allergies.  MEDICATIONS:  Current Outpatient Medications  Medication Sig Dispense Refill   warfarin (COUMADIN) 5 MG tablet Take 1 tablet (5 mg total) by mouth daily. 90 tablet 3   No current facility-administered medications for this visit.    PHYSICAL EXAMINATION: ECOG PERFORMANCE STATUS: 1 - Symptomatic but completely ambulatory  Vitals:   03/02/23 1206  BP: (!) 158/88  Pulse: 66  Resp: 18  Temp: 97.8 F (36.6 C)  SpO2: 100%   Filed Weights   03/02/23 1206  Weight: 163 lb 4.8 oz (74.1 kg)      LABORATORY DATA:  I have reviewed the data as listed    Latest Ref Rng & Units 01/27/2023    5:50 PM 09/21/2018    4:00 AM 09/20/2018   12:12 AM  CMP  Glucose 70 - 99 mg/dL 626  948    BUN 8 - 23 mg/dL 18  16    Creatinine 5.46 - 1.24 mg/dL 2.70  3.50    Sodium 093 - 145 mmol/L 133  139    Potassium 3.5 - 5.1 mmol/L 3.2  4.9    Chloride 98 - 111 mmol/L 97  102    CO2 22 - 32 mmol/L 23  28    Calcium 8.9 - 10.3 mg/dL 8.7  9.1    Total Protein 6.5 - 8.1 g/dL 6.9  7.1   6.8   Total Bilirubin 0.3 - 1.2 mg/dL 0.9  0.4  0.3   Alkaline Phos 38 - 126 U/L 52  48  45   AST 15 - 41 U/L 17  17  16    ALT 0 - 44 U/L 15  15  14      Lab Results  Component Value Date   WBC 5.3 01/27/2023   HGB 14.5 01/27/2023   HCT 43.8 01/27/2023   MCV 93.4 01/27/2023   PLT 187 01/27/2023   NEUTROABS 3.4 01/27/2023    ASSESSMENT & PLAN:  Pulmonary embolism (HCC) COVID-19 pneumonia required hospitalization June 2020 Incidentally found acute PE and bilateral lower extremity DVT Current treatment: Xarelto   Hypercoagulability work-up: Positive for lupus anticoagulant Negative for protein C and protein S deficiencies. Factor V Leiden and prothrombin gene mutations are Negative   Confirmatory testing 03/04/2019: Consistent with lupus anticoagulant.   Recommendation: Anticoagulation for life Current treatment: Coumadin INR today is 1.7 (increase Coumadin to 5 mg Monday through Friday and 7.5 mg on Saturday and Sunday)   INR checks every 3 months    Primary Care Patient without a primary care provider since his previous doctor passed away. -  Recommended [facility] Eric Form at [facility] as a potential primary care provider. -If primary care is established, consider transferring Warfarin management to primary care to reduce patient's healthcare costs.     We will see the patient on an as-needed basis.     No orders of the defined types were placed in this encounter.  The patient has a good understanding of the overall plan. he agrees with it. he will call with any problems that may develop before the next visit here. Total time spent: 30 mins including face to face time and time spent for planning, charting and co-ordination of care   Tamsen Meek, MD 03/02/23

## 2023-03-02 NOTE — Assessment & Plan Note (Signed)
COVID-19 pneumonia required hospitalization June 2020 Incidentally found acute PE and bilateral lower extremity DVT Current treatment: Xarelto   Hypercoagulability work-up: Positive for lupus anticoagulant Negative for protein C and protein S deficiencies. Factor V Leiden and prothrombin gene mutations are Negative   Confirmatory testing 03/04/2019: Consistent with lupus anticoagulant.   Recommendation: Anticoagulation for life Current treatment: Coumadin INR today is (keeping the dose the same)   INR checks every 3 months Return to clinic in 1 year for follow-up

## 2023-06-11 ENCOUNTER — Other Ambulatory Visit: Payer: Self-pay

## 2023-06-11 ENCOUNTER — Telehealth: Payer: Self-pay

## 2023-06-11 ENCOUNTER — Inpatient Hospital Stay: Attending: Hematology and Oncology

## 2023-06-11 ENCOUNTER — Telehealth: Payer: Self-pay | Admitting: Hematology and Oncology

## 2023-06-11 DIAGNOSIS — Z7901 Long term (current) use of anticoagulants: Secondary | ICD-10-CM | POA: Insufficient documentation

## 2023-06-11 DIAGNOSIS — D6862 Lupus anticoagulant syndrome: Secondary | ICD-10-CM | POA: Insufficient documentation

## 2023-06-11 DIAGNOSIS — I2694 Multiple subsegmental pulmonary emboli without acute cor pulmonale: Secondary | ICD-10-CM

## 2023-06-11 DIAGNOSIS — I2699 Other pulmonary embolism without acute cor pulmonale: Secondary | ICD-10-CM

## 2023-06-11 DIAGNOSIS — Z86711 Personal history of pulmonary embolism: Secondary | ICD-10-CM | POA: Diagnosis present

## 2023-06-11 DIAGNOSIS — Z86718 Personal history of other venous thrombosis and embolism: Secondary | ICD-10-CM | POA: Insufficient documentation

## 2023-06-11 LAB — PROTIME-INR
INR: 7.8 (ref 0.8–1.2)
Prothrombin Time: 66.1 s — ABNORMAL HIGH (ref 11.4–15.2)

## 2023-06-11 NOTE — Telephone Encounter (Signed)
 Called patient started 6:01 after received message on INR of 7.8. No answer 4 times. Left message to go to ED immediately if signs of bleeding. If not bleeding and feeling well, hold warfarin and will have clinic call tomorrow for repeat lab and wait for more instruction.  Attempted calling again twice at 7 and no answer, went in to VM. Called brother Mellody Dance, verified the # was correct. Let him know the above.

## 2023-06-11 NOTE — Telephone Encounter (Signed)
 Pt showed up in [facility] lobby requesting to reestablish care with Dr Pamelia Hoit. He was seen in Nov 2024 and was referred to new PCP. Pt reports the new PCP wanted to send him to the coumadin clinic, and he was told he would need to perform finger sticks himself, which he is not comfortable with. He states he is currently takine Coumadin 5 mg M-F and 7.5 mg SAT & SUN and also reports he has not had his PT INR checked since seeing Dr Pamelia Hoit in November.  Per MD, pt needs labs today to check INR and f/u with a phone visit this week. Orders placed per MD. Message sent to scheduling to facilitate phone visit.

## 2023-06-12 ENCOUNTER — Telehealth: Payer: Self-pay | Admitting: *Deleted

## 2023-06-12 NOTE — Telephone Encounter (Signed)
-----   Message from Tamsen Meek sent at 06/12/2023  8:06 AM EST ----- Regarding: please call pt Adelina Mings Please call patient. I called a left a message last night to stop coumadin for a week. Lets bring him back in a week for recheck Vinay ----- Message ----- From: Leory Plowman, Lab In [place] Sent: 06/11/2023   5:43 PM EST To: Serena Croissant, MD

## 2023-06-12 NOTE — Telephone Encounter (Signed)
 RN placed call to pt with below information. Appt scheduled, pt educated and verbalized understanding.

## 2023-06-12 NOTE — Telephone Encounter (Signed)
 RN also educated pt on bleeding precautions and to seek care at nearest ED if pt were to fall or have any s/s of bleeding.  Pt verbalized understanding.

## 2023-06-12 NOTE — Telephone Encounter (Signed)
 error

## 2023-06-14 ENCOUNTER — Inpatient Hospital Stay: Admitting: Hematology and Oncology

## 2023-06-19 ENCOUNTER — Inpatient Hospital Stay

## 2023-06-19 ENCOUNTER — Other Ambulatory Visit: Payer: Self-pay | Admitting: *Deleted

## 2023-06-19 ENCOUNTER — Inpatient Hospital Stay (HOSPITAL_BASED_OUTPATIENT_CLINIC_OR_DEPARTMENT_OTHER): Admitting: Hematology and Oncology

## 2023-06-19 VITALS — BP 133/97 | HR 66 | Temp 97.2°F | Resp 18 | Ht 79.0 in | Wt 164.3 lb

## 2023-06-19 DIAGNOSIS — Z86711 Personal history of pulmonary embolism: Secondary | ICD-10-CM | POA: Diagnosis not present

## 2023-06-19 DIAGNOSIS — I2699 Other pulmonary embolism without acute cor pulmonale: Secondary | ICD-10-CM

## 2023-06-19 DIAGNOSIS — I2694 Multiple subsegmental pulmonary emboli without acute cor pulmonale: Secondary | ICD-10-CM

## 2023-06-19 DIAGNOSIS — R531 Weakness: Secondary | ICD-10-CM

## 2023-06-19 LAB — PROTIME-INR
INR: 1.1 (ref 0.8–1.2)
Prothrombin Time: 14.7 s (ref 11.4–15.2)

## 2023-06-19 NOTE — Progress Notes (Signed)
 Patient Care Team: Patient, No Pcp Per as PCP - General (General Practice)  DIAGNOSIS:  Encounter Diagnosis  Name Primary?   Multiple subsegmental pulmonary emboli without acute cor pulmonale (HCC) Yes   CHIEF COMPLIANT: Follow-up on anticoagulation with Coumadin  HISTORY OF PRESENT ILLNESS:  History of Present Illness Mr. Derek Norris, a patient on Coumadin, presents following a recent fall that resulted in oral trauma and a significant elevation in his INR. He reports that he fell after missing a step while being distracted by people talking to him. The fall resulted in him knocking out all his teeth and bleeding from his mouth. He managed the bleeding at home using ice packs. He also reports that he has been on antibiotics (penicillin) and pain medication since the fall.  In addition to the fall, the patient also reports experiencing back pain and weakness in his legs. He expresses a desire for physical therapy to address these issues. He also mentions that he has been taking aspirin along with his other medications.     ALLERGIES:  has no known allergies.  MEDICATIONS:  Current Outpatient Medications  Medication Sig Dispense Refill   warfarin (COUMADIN) 5 MG tablet Take 1 tablet (5 mg total) by mouth daily. 90 tablet 3   No current facility-administered medications for this visit.    PHYSICAL EXAMINATION: ECOG PERFORMANCE STATUS: 1 - Symptomatic but completely ambulatory  Vitals:   06/19/23 1100  BP: (!) 133/97  Pulse: 66  Resp: 18  Temp: (!) 97.2 F (36.2 C)  SpO2: 100%   Filed Weights   06/19/23 1100  Weight: 164 lb 4.8 oz (74.5 kg)   Generalized weakness and fatigue  LABORATORY DATA:  I have reviewed the data as listed    Latest Ref Rng & Units 01/27/2023    5:50 PM 09/21/2018    4:00 AM 09/20/2018   12:12 AM  CMP  Glucose 70 - 99 mg/dL 409  811    BUN 8 - 23 mg/dL 18  16    Creatinine 9.14 - 1.24 mg/dL 7.82  9.56    Sodium 213 - 145 mmol/L 133  139     Potassium 3.5 - 5.1 mmol/L 3.2  4.9    Chloride 98 - 111 mmol/L 97  102    CO2 22 - 32 mmol/L 23  28    Calcium 8.9 - 10.3 mg/dL 8.7  9.1    Total Protein 6.5 - 8.1 g/dL 6.9  7.1  6.8   Total Bilirubin 0.3 - 1.2 mg/dL 0.9  0.4  0.3   Alkaline Phos 38 - 126 U/L 52  48  45   AST 15 - 41 U/L 17  17  16    ALT 0 - 44 U/L 15  15  14      Lab Results  Component Value Date   WBC 5.3 01/27/2023   HGB 14.5 01/27/2023   HCT 43.8 01/27/2023   MCV 93.4 01/27/2023   PLT 187 01/27/2023   NEUTROABS 3.4 01/27/2023    ASSESSMENT & PLAN:  Pulmonary embolus (HCC) COVID-19 pneumonia required hospitalization June 2020 Incidentally found acute PE and bilateral lower extremity DVT Current treatment: Xarelto   Hypercoagulability work-up: Positive for lupus anticoagulant Negative for protein C and protein S deficiencies. Factor V Leiden and prothrombin gene mutations are Negative   Confirmatory testing 03/04/2019: Consistent with lupus anticoagulant.   Recommendation: Anticoagulation for life Current treatment: Coumadin INR: 7.8 on 06/11/2023 (Coumadin held): Probably because of drug interaction with antibiotics.  INR: 1.1 on 06/19/2023 (restarted Coumadin 5 mg Monday through Friday and 7.5 mg on Saturday and Sunday) Recheck INR in 2 weeks I will see him back in 6 weeks with labs. Patient would like to get his blood INR checked with ours rather than at the Coumadin clinic.   Assessment & Plan Anticoagulation management INR increased to 7.8 due to drug interactions, necessitating temporary cessation of warfarin. INR normalized to 1.1. Restarting warfarin with previous dosing regimen. - Restart warfarin at 5 mg daily with 7.5 mg on weekends. - Recheck INR in two weeks. - Educated on potential drug interactions and importance of INR monitoring.  Fall with facial trauma Larey Seat resulting in facial trauma and tooth loss. Managed with ice packs. Reports leg weakness, requests physical therapy. - Refer to  physical therapy for rehabilitation. - Coordinate with preferred rehabilitation facility.   No orders of the defined types were placed in this encounter.  The patient has a good understanding of the overall plan. he agrees with it. he will call with any problems that may develop before the next visit here. Total time spent: 30 mins including face to face time and time spent for planning, charting and co-ordination of care   Tamsen Meek, MD 06/19/23

## 2023-06-19 NOTE — Progress Notes (Signed)
 Per MD request RN successfully faxed PT referral to [facility] in [place] [phone]).

## 2023-06-19 NOTE — Assessment & Plan Note (Signed)
 COVID-19 pneumonia required hospitalization June 2020 Incidentally found acute PE and bilateral lower extremity DVT Current treatment: Xarelto   Hypercoagulability work-up: Positive for lupus anticoagulant Negative for protein C and protein S deficiencies. Factor V Leiden and prothrombin gene mutations are Negative   Confirmatory testing 03/04/2019: Consistent with lupus anticoagulant.   Recommendation: Anticoagulation for life Current treatment: Coumadin INR: 7.8 on 06/11/2023 (Coumadin held)

## 2023-07-03 ENCOUNTER — Inpatient Hospital Stay

## 2023-07-05 ENCOUNTER — Telehealth: Payer: Self-pay | Admitting: Hematology and Oncology

## 2023-07-05 NOTE — Telephone Encounter (Signed)
 Scheduled appointment per patients request via incoming call. Talked with the patient and he is aware of the made appointment.

## 2023-07-06 ENCOUNTER — Inpatient Hospital Stay

## 2023-07-06 DIAGNOSIS — I2699 Other pulmonary embolism without acute cor pulmonale: Secondary | ICD-10-CM

## 2023-07-06 DIAGNOSIS — Z86711 Personal history of pulmonary embolism: Secondary | ICD-10-CM | POA: Diagnosis not present

## 2023-07-06 LAB — PROTIME-INR
INR: 3.4 — ABNORMAL HIGH (ref 0.8–1.2)
Prothrombin Time: 34.7 s — ABNORMAL HIGH (ref 11.4–15.2)

## 2023-07-31 ENCOUNTER — Other Ambulatory Visit: Payer: Self-pay | Admitting: *Deleted

## 2023-07-31 DIAGNOSIS — I2699 Other pulmonary embolism without acute cor pulmonale: Secondary | ICD-10-CM

## 2023-08-01 ENCOUNTER — Inpatient Hospital Stay

## 2023-08-01 ENCOUNTER — Inpatient Hospital Stay: Attending: Hematology and Oncology | Admitting: Hematology and Oncology

## 2023-08-01 NOTE — Assessment & Plan Note (Deleted)
 COVID-19 pneumonia required hospitalization June 2020 Incidentally found acute PE and bilateral lower extremity DVT Current treatment: Xarelto    Hypercoagulability work-up: Positive for lupus anticoagulant Negative for protein C and protein S deficiencies. Factor V Leiden and prothrombin gene mutations are Negative   Confirmatory testing 03/04/2019: Consistent with lupus anticoagulant.   Recommendation: Anticoagulation for life Current treatment: Coumadin  INR: 7.8 on 06/11/2023 (Coumadin  held): Probably because of drug interaction with antibiotics. INR: 1.1 on 06/19/2023 (restarted Coumadin  5 mg Monday through Friday and 7.5 mg on Saturday and Sunday) INR: 3.4 on 07/06/23    Patient would like to get his blood INR checked with us  rather than at the Coumadin  clinic.

## 2024-04-14 ENCOUNTER — Other Ambulatory Visit: Payer: Self-pay | Admitting: Hematology and Oncology

## 2024-04-14 ENCOUNTER — Telehealth: Payer: Self-pay

## 2024-04-14 MED ORDER — WARFARIN SODIUM 5 MG PO TABS: 5.0000 mg | ORAL_TABLET | Freq: Every day | ORAL | 0 refills | Status: DC

## 2024-04-14 NOTE — Telephone Encounter (Signed)
 Pt called and states he needs a refill on his coumadin . Pt has not been seen by our office since 06/2023 and was noncompliant with appointment recommendations for labs as he no-showed several visits.   Pt states he thought his refill for coumadin  was automatic because he needs to be on this medication for life. Pt was educated on coumadin  and all the risks and importance of lab work to closely monitor PT INR. 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   Pt was scheduled for 05/01/24 at 1015 labs and 1045 MD visit. He is agreeable.  Per MD, since pt is out of his coumadin , OK to send 30 day supply with no refills as pt will need to be seen for more.

## 2024-04-14 NOTE — Telephone Encounter (Signed)
 Error - duplicate

## 2024-05-01 ENCOUNTER — Inpatient Hospital Stay: Attending: Hematology and Oncology

## 2024-05-01 ENCOUNTER — Telehealth: Payer: Self-pay

## 2024-05-01 ENCOUNTER — Inpatient Hospital Stay: Admitting: Hematology and Oncology

## 2024-05-01 ENCOUNTER — Other Ambulatory Visit: Payer: Self-pay

## 2024-05-01 VITALS — BP 120/80 | HR 65 | Temp 98.4°F | Resp 18 | Ht >= 80 in | Wt 163.4 lb

## 2024-05-01 DIAGNOSIS — Z86711 Personal history of pulmonary embolism: Secondary | ICD-10-CM | POA: Diagnosis present

## 2024-05-01 DIAGNOSIS — Z86718 Personal history of other venous thrombosis and embolism: Secondary | ICD-10-CM | POA: Insufficient documentation

## 2024-05-01 DIAGNOSIS — Z7901 Long term (current) use of anticoagulants: Secondary | ICD-10-CM | POA: Insufficient documentation

## 2024-05-01 DIAGNOSIS — D6862 Lupus anticoagulant syndrome: Secondary | ICD-10-CM | POA: Insufficient documentation

## 2024-05-01 DIAGNOSIS — I2694 Multiple subsegmental pulmonary emboli without acute cor pulmonale: Secondary | ICD-10-CM | POA: Diagnosis not present

## 2024-05-01 LAB — PROTIME-INR
INR: 5.1 (ref 0.8–1.2)
Prothrombin Time: 48.9 s — ABNORMAL HIGH (ref 11.4–15.2)

## 2024-05-01 MED ORDER — WARFARIN SODIUM 5 MG PO TABS: 2.5000 mg | ORAL_TABLET | Freq: Every day | ORAL | 1 refills | Status: AC

## 2024-05-01 NOTE — Progress Notes (Unsigned)
 Critical INR 5.1 called and read back to Nolan Music RN at 1246 pm by Aldona Alexander (MLS) 05/01/2024

## 2024-05-01 NOTE — Progress Notes (Signed)
 HEMATOLOGY-ONCOLOGY TELEPHONE VISIT PROGRESS NOTE  I connected with our patient on 05/01/24 at 10:45 AM EST by telephone and verified that I am speaking with the correct person using two identifiers.  I discussed the limitations, risks, security and privacy concerns of performing an evaluation and management service by telephone and the availability of in person appointments.  I also discussed with the patient that there may be a patient responsible charge related to this service. The patient expressed understanding and agreed to proceed.   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  He takes warfarin 5 mg daily and took his last dose this morning. Routine labs today showed an INR of 5.1. He had run out of warfarin before this visit and needed a refill.  He is physically active, including recent hiking with a backpack and upcoming travel that will involve prolonged walking with a 15-pound day pack. He has shoulder soreness after carrying an 18-pound backpack for four miles but no injuries or falls. He denies chest pain, dyspnea, palpitations, or other new cardiopulmonary symptoms.        REVIEW OF SYSTEMS:   Constitutional: Denies fevers, chills or abnormal weight loss All other systems were reviewed with the patient and are negative. Observations/Objective:     Assessment Plan:  Pulmonary embolus (HCC) COVID-19 pneumonia required hospitalization June 2020 Incidentally found acute PE and bilateral lower extremity DVT Current treatment: Xarelto    Hypercoagulability work-up: Positive for lupus anticoagulant Negative for protein C and protein S deficiencies. Factor V Leiden and prothrombin gene mutations are Negative   Confirmatory testing 03/04/2019:  Consistent with lupus anticoagulant.   Recommendation: Anticoagulation for life Current treatment: Coumadin  INR: 7.8 on 06/11/2023 (Coumadin  held): Probably because of drug interaction with antibiotics. INR: 1.1 on 06/19/2023 (restarted Coumadin  5 mg Monday through Friday and 7.5 mg on Saturday and Sunday) INR 5.1 on 05/01/2024: Recommended holding Coumadin  for 3 days and then starting back on 2.5 mg of Coumadin  a day.  Recheck labs in 3 weeks and telephone call after that   I discussed the assessment and treatment plan with the patient. The patient was provided an opportunity to ask questions and all were answered. The patient agreed with the plan and demonstrated an understanding of the instructions. The patient was advised to call back or seek an in-person evaluation if the symptoms worsen or if the condition fails to improve as anticipated.   I provided 20 minutes of non-face-to-face time during this encounter.  This includes time for charting and coordination of care   Naomi MARLA Chad, MD

## 2024-05-01 NOTE — Telephone Encounter (Signed)
 CRITICAL VALUE STICKER  CRITICAL VALUE: 5.1 INR  RECEIVER [facility] Jessie):  DATE & TIME NOTIFIED: 05/01/24 @12 :45PM  MESSENGER (representative from lab):  MD NOTIFIED: DR. ODEAN  TIME OF NOTIFICATION: 12:49PM  RESPONSE: PATIENT HAS VISIT WITH PROVIDER

## 2024-05-01 NOTE — Assessment & Plan Note (Addendum)
 COVID-19 pneumonia required hospitalization June 2020 Incidentally found acute PE and bilateral lower extremity DVT Current treatment: Xarelto    Hypercoagulability work-up: Positive for lupus anticoagulant Negative for protein C and protein S deficiencies. Factor V Leiden and prothrombin gene mutations are Negative   Confirmatory testing 03/04/2019: Consistent with lupus anticoagulant.   Recommendation: Anticoagulation for life Current treatment: Coumadin  INR: 7.8 on 06/11/2023 (Coumadin  held): Probably because of drug interaction with antibiotics. INR: 1.1 on 06/19/2023 (restarted Coumadin  5 mg Monday through Friday and 7.5 mg on Saturday and Sunday) INR 5.1 on 05/01/2024: Recommended holding Coumadin  for 3 days and then starting back on 2.5 mg of Coumadin  a day.  Recheck labs in 3 weeks and telephone call after the

## 2024-05-11 ENCOUNTER — Other Ambulatory Visit: Payer: Self-pay | Admitting: Hematology and Oncology

## 2024-05-12 ENCOUNTER — Other Ambulatory Visit: Payer: Self-pay

## 2024-05-23 ENCOUNTER — Inpatient Hospital Stay

## 2024-05-26 ENCOUNTER — Inpatient Hospital Stay: Admitting: Hematology and Oncology
